# Patient Record
Sex: Female | Born: 1960 | Race: White | Hispanic: No | Marital: Married | Smoking: Never smoker
Health system: Southern US, Community
[De-identification: ages and names within clinical notes are randomized; demographics above are authoritative.]

## PROBLEM LIST (undated history)

## (undated) DIAGNOSIS — G709 Myoneural disorder, unspecified: Secondary | ICD-10-CM

## (undated) DIAGNOSIS — N200 Calculus of kidney: Secondary | ICD-10-CM

## (undated) HISTORY — DX: Myoneural disorder, unspecified: G70.9

## (undated) HISTORY — PX: CARPAL TUNNEL RELEASE: SHX101

## (undated) HISTORY — PX: COLONOSCOPY: SHX174

## (undated) HISTORY — PX: TONSILLECTOMY: SUR1361

---

## 1997-09-28 ENCOUNTER — Other Ambulatory Visit: Admission: RE | Admit: 1997-09-28 | Discharge: 1997-09-28 | Payer: Self-pay | Admitting: Obstetrics and Gynecology

## 1997-11-14 ENCOUNTER — Other Ambulatory Visit: Admission: RE | Admit: 1997-11-14 | Discharge: 1997-11-14 | Payer: Self-pay | Admitting: Obstetrics and Gynecology

## 1998-02-27 ENCOUNTER — Other Ambulatory Visit: Admission: RE | Admit: 1998-02-27 | Discharge: 1998-02-27 | Payer: Self-pay | Admitting: Obstetrics and Gynecology

## 1998-10-09 ENCOUNTER — Other Ambulatory Visit: Admission: RE | Admit: 1998-10-09 | Discharge: 1998-10-09 | Payer: Self-pay | Admitting: Obstetrics and Gynecology

## 1999-03-24 ENCOUNTER — Other Ambulatory Visit: Admission: RE | Admit: 1999-03-24 | Discharge: 1999-03-24 | Payer: Self-pay | Admitting: Obstetrics and Gynecology

## 1999-10-01 ENCOUNTER — Other Ambulatory Visit: Admission: RE | Admit: 1999-10-01 | Discharge: 1999-10-01 | Payer: Self-pay | Admitting: Obstetrics and Gynecology

## 1999-10-23 ENCOUNTER — Emergency Department (HOSPITAL_COMMUNITY): Admission: EM | Admit: 1999-10-23 | Discharge: 1999-10-23 | Payer: Self-pay

## 2000-11-24 ENCOUNTER — Other Ambulatory Visit: Admission: RE | Admit: 2000-11-24 | Discharge: 2000-11-24 | Payer: Self-pay | Admitting: Obstetrics and Gynecology

## 2001-12-21 ENCOUNTER — Other Ambulatory Visit: Admission: RE | Admit: 2001-12-21 | Discharge: 2001-12-21 | Payer: Self-pay | Admitting: Obstetrics and Gynecology

## 2002-09-25 ENCOUNTER — Ambulatory Visit (HOSPITAL_COMMUNITY): Admission: RE | Admit: 2002-09-25 | Discharge: 2002-09-25 | Payer: Self-pay | Admitting: Obstetrics and Gynecology

## 2002-09-25 ENCOUNTER — Encounter: Payer: Self-pay | Admitting: Obstetrics and Gynecology

## 2002-11-16 ENCOUNTER — Ambulatory Visit (HOSPITAL_COMMUNITY): Admission: RE | Admit: 2002-11-16 | Discharge: 2002-11-16 | Payer: Self-pay | Admitting: Obstetrics and Gynecology

## 2003-01-04 ENCOUNTER — Other Ambulatory Visit: Admission: RE | Admit: 2003-01-04 | Discharge: 2003-01-04 | Payer: Self-pay | Admitting: Obstetrics and Gynecology

## 2003-09-04 ENCOUNTER — Other Ambulatory Visit: Admission: RE | Admit: 2003-09-04 | Discharge: 2003-09-04 | Payer: Self-pay | Admitting: Obstetrics and Gynecology

## 2004-01-08 ENCOUNTER — Encounter: Admission: RE | Admit: 2004-01-08 | Discharge: 2004-01-08 | Payer: Self-pay | Admitting: Obstetrics and Gynecology

## 2004-03-02 ENCOUNTER — Inpatient Hospital Stay (HOSPITAL_COMMUNITY): Admission: AD | Admit: 2004-03-02 | Discharge: 2004-03-05 | Payer: Self-pay | Admitting: Obstetrics & Gynecology

## 2004-04-07 ENCOUNTER — Other Ambulatory Visit: Admission: RE | Admit: 2004-04-07 | Discharge: 2004-04-07 | Payer: Self-pay | Admitting: Obstetrics and Gynecology

## 2004-05-18 ENCOUNTER — Inpatient Hospital Stay (HOSPITAL_COMMUNITY): Admission: AD | Admit: 2004-05-18 | Discharge: 2004-05-18 | Payer: Self-pay | Admitting: Obstetrics and Gynecology

## 2004-07-15 ENCOUNTER — Ambulatory Visit (HOSPITAL_COMMUNITY): Admission: RE | Admit: 2004-07-15 | Discharge: 2004-07-15 | Payer: Self-pay | Admitting: Obstetrics and Gynecology

## 2005-01-31 ENCOUNTER — Ambulatory Visit (HOSPITAL_COMMUNITY): Admission: RE | Admit: 2005-01-31 | Discharge: 2005-01-31 | Payer: Self-pay | Admitting: Family Medicine

## 2005-02-16 HISTORY — PX: ENDOMETRIAL ABLATION: SHX621

## 2005-02-16 HISTORY — PX: TUBAL LIGATION: SHX77

## 2005-02-16 HISTORY — PX: DILATION AND CURETTAGE OF UTERUS: SHX78

## 2005-05-25 ENCOUNTER — Other Ambulatory Visit: Admission: RE | Admit: 2005-05-25 | Discharge: 2005-05-25 | Payer: Self-pay | Admitting: Obstetrics and Gynecology

## 2012-10-26 ENCOUNTER — Other Ambulatory Visit: Payer: Self-pay | Admitting: Obstetrics and Gynecology

## 2012-12-19 ENCOUNTER — Other Ambulatory Visit: Payer: Self-pay | Admitting: Gastroenterology

## 2013-01-10 ENCOUNTER — Other Ambulatory Visit: Payer: Self-pay | Admitting: Gastroenterology

## 2013-01-19 ENCOUNTER — Other Ambulatory Visit: Payer: Self-pay

## 2013-01-25 ENCOUNTER — Ambulatory Visit
Admission: RE | Admit: 2013-01-25 | Discharge: 2013-01-25 | Disposition: A | Discharge status: Home / Self Care | Payer: Managed Care, Other (non HMO) | Source: Ambulatory Visit | Attending: Gastroenterology | Admitting: Gastroenterology

## 2013-03-10 ENCOUNTER — Emergency Department (HOSPITAL_BASED_OUTPATIENT_CLINIC_OR_DEPARTMENT_OTHER)
Admission: EM | Admit: 2013-03-10 | Discharge: 2013-03-10 | Disposition: A | Discharge status: Home / Self Care | Payer: Managed Care, Other (non HMO) | Attending: Emergency Medicine | Admitting: Emergency Medicine

## 2013-03-10 ENCOUNTER — Encounter (HOSPITAL_BASED_OUTPATIENT_CLINIC_OR_DEPARTMENT_OTHER): Payer: Self-pay | Admitting: Emergency Medicine

## 2013-03-10 ENCOUNTER — Emergency Department (HOSPITAL_BASED_OUTPATIENT_CLINIC_OR_DEPARTMENT_OTHER): Payer: Managed Care, Other (non HMO)

## 2013-03-10 DIAGNOSIS — Z792 Long term (current) use of antibiotics: Secondary | ICD-10-CM | POA: Insufficient documentation

## 2013-03-10 DIAGNOSIS — N200 Calculus of kidney: Secondary | ICD-10-CM

## 2013-03-10 DIAGNOSIS — Z3202 Encounter for pregnancy test, result negative: Secondary | ICD-10-CM | POA: Insufficient documentation

## 2013-03-10 LAB — CBC WITH DIFFERENTIAL/PLATELET
BASOS ABS: 0 10*3/uL (ref 0.0–0.1)
Basophils Relative: 0 % (ref 0–1)
EOS PCT: 1 % (ref 0–5)
Eosinophils Absolute: 0.1 10*3/uL (ref 0.0–0.7)
HEMATOCRIT: 45.2 % (ref 36.0–46.0)
Hemoglobin: 14.8 g/dL (ref 12.0–15.0)
LYMPHS PCT: 40 % (ref 12–46)
Lymphs Abs: 2.6 10*3/uL (ref 0.7–4.0)
MCH: 29 pg (ref 26.0–34.0)
MCHC: 32.7 g/dL (ref 30.0–36.0)
MCV: 88.5 fL (ref 78.0–100.0)
MONO ABS: 0.5 10*3/uL (ref 0.1–1.0)
Monocytes Relative: 8 % (ref 3–12)
Neutro Abs: 3.3 10*3/uL (ref 1.7–7.7)
Neutrophils Relative %: 51 % (ref 43–77)
Platelets: 219 10*3/uL (ref 150–400)
RBC: 5.11 MIL/uL (ref 3.87–5.11)
RDW: 13.4 % (ref 11.5–15.5)
WBC: 6.5 10*3/uL (ref 4.0–10.5)

## 2013-03-10 LAB — COMPREHENSIVE METABOLIC PANEL
ALT: 90 U/L — ABNORMAL HIGH (ref 0–35)
AST: 42 U/L — AB (ref 0–37)
Albumin: 4.7 g/dL (ref 3.5–5.2)
Alkaline Phosphatase: 109 U/L (ref 39–117)
BUN: 13 mg/dL (ref 6–23)
CO2: 25 meq/L (ref 19–32)
CREATININE: 0.7 mg/dL (ref 0.50–1.10)
Calcium: 11.7 mg/dL — ABNORMAL HIGH (ref 8.4–10.5)
Chloride: 106 mEq/L (ref 96–112)
GFR calc non Af Amer: >90 mL/min (ref >90)
GLUCOSE: 106 mg/dL — AB (ref 70–99)
Potassium: 4.4 mEq/L (ref 3.7–5.3)
Sodium: 145 mEq/L (ref 137–147)
TOTAL PROTEIN: 7.7 g/dL (ref 6.0–8.3)
Total Bilirubin: 0.6 mg/dL (ref 0.3–1.2)

## 2013-03-10 LAB — URINALYSIS, ROUTINE W REFLEX MICROSCOPIC
Bilirubin Urine: NEGATIVE
Glucose, UA: NEGATIVE mg/dL
Ketones, ur: NEGATIVE mg/dL
NITRITE: NEGATIVE
Protein, ur: NEGATIVE mg/dL
SPECIFIC GRAVITY, URINE: 1.021 (ref 1.005–1.030)
Urobilinogen, UA: 0.2 mg/dL (ref 0.0–1.0)
pH: 6 (ref 5.0–8.0)

## 2013-03-10 LAB — URINE MICROSCOPIC-ADD ON

## 2013-03-10 LAB — PREGNANCY, URINE: PREG TEST UR: NEGATIVE

## 2013-03-10 MED ORDER — MORPHINE SULFATE 4 MG/ML IJ SOLN
4.0000 mg | Freq: Once | INTRAMUSCULAR | Status: AC
  Administered 2013-03-10: 4 mg via INTRAVENOUS
  Filled 2013-03-10: qty 1

## 2013-03-10 MED ORDER — ONDANSETRON HCL 4 MG/2ML IJ SOLN
4.0000 mg | Freq: Once | INTRAMUSCULAR | Status: AC
  Administered 2013-03-10: 4 mg via INTRAVENOUS
  Filled 2013-03-10: qty 2

## 2013-03-10 MED ORDER — OXYCODONE-ACETAMINOPHEN 5-325 MG PO TABS: 1.0000 | ORAL_TABLET | Freq: Four times a day (QID) | ORAL | Status: DC | PRN

## 2013-03-10 MED ORDER — CEPHALEXIN 500 MG PO CAPS: 500.0000 mg | ORAL_CAPSULE | Freq: Two times a day (BID) | ORAL | Status: DC

## 2013-03-10 NOTE — ED Provider Notes (Signed)
CSN: [id number]     Arrival date & time 03/10/13  8469 History   First MD Initiated Contact with Patient 03/10/13 1002     Chief Complaint  Patient presents with  . Flank Pain  . Abdominal Pain   (Consider location/radiation/quality/duration/timing/severity/associated sxs/prior Treatment) HPI  This is a 53 year old female with no significant past medical history who presents with right flank pain. Patient reports onset of pain yesterday. She states that initially she had noted pain in her right lower quadrant but now the pain is consistently in her right flank and radiates into her right lower quadrant. Currently her pain is 8/10. The pain is constant but waxes and wanes in intensity. Nothing makes the pain better or worse. Patient denies dysuria or hematuria. She has no history of kidney stones.   Patient does endorse nausea without vomiting.  Denies any fevers.  History reviewed. No pertinent past medical history. History reviewed. No pertinent past surgical history. History reviewed. No pertinent family history. History  Substance Use Topics  . Smoking status: Never Smoker   . Smokeless tobacco: Not on file  . Alcohol Use: Not on file   OB History   Grav Para Term Preterm Abortions TAB SAB Ect Mult Living                 Review of Systems  Constitutional: Negative for fever.  Respiratory: Negative for chest tightness and shortness of breath.   Cardiovascular: Negative for chest pain.  Gastrointestinal: Positive for nausea. Negative for vomiting, abdominal pain and diarrhea.  Genitourinary: Positive for flank pain. Negative for dysuria, frequency and hematuria.  Musculoskeletal: Negative for back pain.  Skin: Negative for wound.  Neurological: Negative for headaches.  Psychiatric/Behavioral: Negative for confusion.  All other systems reviewed and are negative.    Allergies  Review of patient's allergies indicates no known allergies.  Home Medications   Current  Outpatient Rx  Name  Route  Sig  Dispense  Refill  . cephALEXin (KEFLEX) 500 MG capsule   Oral   Take 1 capsule (500 mg total) by mouth 2 (two) times daily.   10 capsule   0   . oxyCODONE-acetaminophen (PERCOCET/ROXICET) 5-325 MG per tablet   Oral   Take 1 tablet by mouth every 6 (six) hours as needed for severe pain.   15 tablet   0    BP 130/80  Pulse 72  Temp(Src) 98.4 F (36.9 C) (Oral)  Resp 18  SpO2 100% Physical Exam  Nursing note and vitals reviewed. Constitutional: She is oriented to person, place, and time. She appears well-developed and well-nourished.  Uncomfortable appearing but nontoxic  HENT:  Head: Normocephalic and atraumatic.  Eyes: Pupils are equal, round, and reactive to light.  Neck: Neck supple.  Cardiovascular: Normal rate, regular rhythm and normal heart sounds.   Pulmonary/Chest: Effort normal. No respiratory distress. She has no wheezes.  Abdominal: Soft. Bowel sounds are normal. There is no tenderness. There is no rebound and no guarding.  Musculoskeletal: She exhibits no edema.  Neurological: She is alert and oriented to person, place, and time.  Skin: Skin is warm and dry.  Psychiatric: She has a normal mood and affect.    ED Course  Procedures (including critical care time) Labs Review Labs Reviewed  COMPREHENSIVE METABOLIC PANEL - Abnormal; Notable for the following:    Glucose, Bld 106 (*)    Calcium 11.7 (*)    AST 42 (*)    ALT 90 (*)  All other components within normal limits  URINALYSIS, ROUTINE W REFLEX MICROSCOPIC - Abnormal; Notable for the following:    APPearance CLOUDY (*)    Hgb urine dipstick MODERATE (*)    Leukocytes, UA SMALL (*)    All other components within normal limits  URINE MICROSCOPIC-ADD ON - Abnormal; Notable for the following:    Squamous Epithelial / LPF FEW (*)    Bacteria, UA MANY (*)    Crystals CA OXALATE CRYSTALS (*)    All other components within normal limits  URINE CULTURE  CBC WITH  DIFFERENTIAL  PREGNANCY, URINE   Imaging Review Ct Abdomen Pelvis Wo Contrast  03/10/2013   CLINICAL DATA:  Right flank pain.  EXAM: CT ABDOMEN AND PELVIS WITHOUT CONTRAST  TECHNIQUE: Multidetector CT imaging of the abdomen and pelvis was performed following the standard protocol without intravenous contrast.  COMPARISON:  Ultrasound 01/25/2013.  CT 01/31/2005.  FINDINGS: Multiple simple hepatic cysts noted. Similar findings noted on prior ultrasound and CT. Spleen is normal. Pancreas is normal. No biliary distention. Gallbladder is not distended. No gallbladder wall thickening. No pericholecystic fluid collection.  Adrenals are normal. A 2 mm stone is in the mid right ureter, image number 52/series 2. This results in moderate proximal hydroureter and hydronephrosis. Left kidney and renal collecting system are unremarkable. Bladder is nondistended. Innumerable calcifications in the pelvis consistent with phleboliths. Uterus and adnexa are unremarkable. No free pelvic fluid.  No significant adenopathy.  Aorta normal caliber.  Appendix normal. No inflammatory change in right or left lower quadrant. No bowel distention. Stomach is nondistended. Esophagogastric and the gastroduodenal regions are normal. No free air. No mesenteric masses. Small umbilical hernia with herniation of fat only.  Heart size normal. Mild subsegmental atelectasis lung bases. Degenerative changes lumbosacral spine and both hips.  IMPRESSION: 1. 2 mm stone in the right mid ureter with resultant right hydronephrosis and hydroureter. 2. Multiple simple hepatic cysts.   Electronically Signed   By: Marcello Moores  Register   On: 03/10/2013 11:13    EKG Interpretation   None       MDM   1. Kidney stones   2. Hypercalcemia    Patient presents with right-sided flank pain that radiates into her right groin.  Patient's history is suggestive of kidney stones. Other considerations include ovarian pathology or appendicitis.  Patient was given pain  medication. Lab work is unremarkable and shows normal kidney function.   Patient was noted to be hypercalcemic at 11.7.  Patient does have 3-6 white cells and bacteria in her urine. There is no overt infection and urine culture was sent. CT scan shows 2 mm stone with hydronephrosis and hydroureter. On reevaluation, patient reports complete resolution of her symptoms.  Patient was informed of her diagnosis.  She was encouraged to use aggressive hydration at home and expected management for the kidney stone. She will cover her with Keflex given the bacteria in her urine. Regarding patient's hypercalcemia, this is of unknown significance at this time. May clear following hydration. I have encouraged patient to followup with her primary doctor in one to 2 weeks for repeat calcium check as patient was informed that persistent hypercalcemia can be a result of more pathologic processes including cancer or  parathyroid disease.  After history, exam, and medical workup I feel the patient has been appropriately medically screened and is safe for discharge home. Pertinent diagnoses were discussed with the patient. Patient was given return precautions.     Merryl Hacker, MD  03/10/13 1314 

## 2013-03-10 NOTE — ED Notes (Signed)
Pt amb to room 9 with quick steady gait in nad. Pt reports right flank pain radiating to right lower quadrant. Pt states pain is constant but waxes and wanes.

## 2013-03-10 NOTE — Discharge Instructions (Signed)
Kidney Stones Kidney stones (urolithiasis) are deposits that form inside your kidneys. The intense pain is caused by the stone moving through the urinary tract. When the stone moves, the ureter goes into spasm around the stone. The stone is usually passed in the urine.  CAUSES   A disorder that makes certain neck glands produce too much parathyroid hormone (primary hyperparathyroidism).  A buildup of uric acid crystals, similar to gout in your joints.  Narrowing (stricture) of the ureter.  A kidney obstruction present at birth (congenital obstruction).  Previous surgery on the kidney or ureters.  Numerous kidney infections. SYMPTOMS   Feeling sick to your stomach (nauseous).  Throwing up (vomiting).  Blood in the urine (hematuria).  Pain that usually spreads (radiates) to the groin.  Frequency or urgency of urination. DIAGNOSIS   Taking a history and physical exam.  Blood or urine tests.  CT scan.  Occasionally, an examination of the inside of the urinary bladder (cystoscopy) is performed. TREATMENT   Observation.  Increasing your fluid intake.  Extracorporeal shock wave lithotripsy This is a noninvasive procedure that uses shock waves to break up kidney stones.  Surgery may be needed if you have severe pain or persistent obstruction. There are various surgical procedures. Most of the procedures are performed with the use of small instruments. Only small incisions are needed to accommodate these instruments, so recovery time is minimized. The size, location, and chemical composition are all important variables that will determine the proper choice of action for you. Talk to your health care provider to better understand your situation so that you will minimize the risk of injury to yourself and your kidney.  HOME CARE INSTRUCTIONS   Drink enough water and fluids to keep your urine clear or pale yellow. This will help you to pass the stone or stone fragments.  Strain  all urine through the provided strainer. Keep all particulate matter and stones for your health care provider to see. The stone causing the pain may be as small as a grain of salt. It is very important to use the strainer each and every time you pass your urine. The collection of your stone will allow your health care provider to analyze it and verify that a stone has actually passed. The stone analysis will often identify what you can do to reduce the incidence of recurrences.  Only take over-the-counter or prescription medicines for pain, discomfort, or fever as directed by your health care provider.  Make a follow-up appointment with your health care provider as directed.  Get follow-up X-rays if required. The absence of pain does not always mean that the stone has passed. It may have only stopped moving. If the urine remains completely obstructed, it can cause loss of kidney function or even complete destruction of the kidney. It is your responsibility to make sure X-rays and follow-ups are completed. Ultrasounds of the kidney can show blockages and the status of the kidney. Ultrasounds are not associated with any radiation and can be performed easily in a matter of minutes. SEEK MEDICAL CARE IF:  You experience pain that is progressive and unresponsive to any pain medicine you have been prescribed. SEEK IMMEDIATE MEDICAL CARE IF:   Pain cannot be controlled with the prescribed medicine.  You have a fever or shaking chills.  The severity or intensity of pain increases over 18 hours and is not relieved by pain medicine.  You develop a new onset of abdominal pain.  You feel faint or pass  out.  You are unable to urinate. MAKE SURE YOU:   Understand these instructions.  Will watch your condition.  Will get help right away if you are not doing well or get worse. Document Released: 02/02/2005 Document Revised: 10/05/2012 Document Reviewed: 07/06/2012 [facility] Patient Information 2014  [place].  Diet for Kidney Stones Kidney stones are small, hard masses that form inside your kidneys. They are made up of salts and minerals and often form when high levels build up in the urine. The minerals can then start to build up, crystalize, and stick together to form stones. There are several different types of kidney stones. The following types of stones may be influenced by dietary factors:   Calcium Oxalate Stones. An oxalate is a salt found in certain foods. Within the body, calcium can combine with oxalates to form calcium oxalate stones, which can be excreted in the urine in high amounts. This is the most common type of kidney stone.  Calcium Phosphate Stones. These stones may occur when the pH of the urine becomes too high, or less acidic, from too much calcium being excreted in the urine. The pH is a measure of how acidic or basic a substance is.  Uric Acid Stones. This type of stone occurs when the pH of the urine becomes too low, or very acidic, because substances called purines build up in the urine. Purines are found in animal proteins. When the urine is highly concentrated with acid, uric acid kidney stones can form.  Other risk factors for kidney stones include genetics, environment, and being overweight. Your caregiver may ask you to follow specific diet guidelines based on the type of stone you have to lessen the chances of your body making more kidney stones.  GENERAL GUIDELINES FOR ALL TYPES OF STONES  Drink plenty of fluid. Drink 12 16 cups of fluid a day, drinking mainly water.This is the most important thing you can do to prevent the formation of future kidney stones.  Maintain a healthy weight. Your caregiver or dietitian can help you determine what a healthy weight is for you. If you are overweight, weight loss may help prevent the formation of future kidney stones.  Eat a diet adequate in animal protein. Too much animal protein can contribute to the formation  of stones. Your dietitian can help you determine how much protein you should be eating. Avoid low carbohydrate, high protein diets.  Follow a balanced eating approach. The DASH diet, which stands for "Dietary Approaches to Stop Hypertension," is an effective meal plan for reducing stone formation. This diet is high in fruits, vegetables, dairy, and whole grains and low in animal protein. Ask your caregiver or dietitian for information about the DASH diet. ADDITIONAL DIET GUIDELINES FOR CALCIUM STONES Avoid foods high in salt. This includes table salt, salt seasonings, MSG, soy sauce, cured and processed meats, salted crackers and snack foods, fast food, and canned soups and foods. Ask your caregiver or dietitian for information about reducing sodium in your diet or following the low sodium diet.  Ensure adequate calcium intake. Use the following table for calcium guidelines:  Men 81 years old and younger  1000 mg/day.  Men 46 years old and older  1500 mg/day.  Women 51 51 years old  1000 mg/day.  Women 50 years and older  1500 mg/day. Your dietitian can help you determine if you are getting enough calcium in your diet. Foods that are high in calcium include dairy products, broccoli, cheese, yogurt, and  pudding. If you need to take a calcium supplement, take it only in the form of calcium citrate.  Avoid foods high in oxalate. Be sure that any supplements you take do not contain more than 500 mg of vitamin C. Vitamin C is converted into oxalate in the body. You do not need to avoid fruits and vegetables high in vitamin C.   Grains: High-fiber or bran cereal, whole-wheat bread, grits, barley, buckwheat, amaranth, pretzels, and fruitcake.  Vegetables: Dried beans, wax beans, dark leafy greens, eggplant, leeks, okra, parsley, rutabaga, tomato paste, watercress, zucchini, and escarole.  Fruit: Dried apricots, red currants, figs, kiwi, and rhubarb.  Meat and Meat Substitutes: Soybeans and foods made  from soy (soyburger, miso), dried beans, peanut butter.  Milk: Chocolate milk mixes and soymilk.  Fats and Oils: Nuts (peanuts, almonds, pecans, cashews, hazelnuts) and nut butters, sesame seeds, and tDahini paste.  Condiments/Miscellaneous: Chocolate, carob, marmalade, poppy seeds, instant iced tea, and juice from high-oxalate fruits.  Document Released: 05/30/2010 Document Revised: 08/04/2011 Document Reviewed: 07/20/2011 [facility] Patient Information 2014 [place].  Hypercalcemia Hypercalcemia means the calcium in your blood is too high. Calcium in our blood is important for the control of many things, such as:  Blood clotting.  Conducting of nerve impulses.  Muscle contraction.  Maintaining teeth and bone health.  Other body functions. In the bloodstream, calcium maintains a constant balance with another mineral, phosphate. Calcium is absorbed into the body through the small intestine. This is helped by Vitamin D. Calcium levels are maintained mostly by vitamin D and a hormone (parathyroid hormone). But the kidneys also help. Hypercalcemia can happen when the concentration of calcium is too high for the kidneys to maintain balance. The body maintains a balance between the calcium we eat and the calcium already in our body. If calcium intake is increased or we cannot use calcium properly, there may be problems. Some common sources of calcium are:   Dairy products.  Nuts.  Eggs.  Whole grains.  Legumes.  Green leafy vegetables. CAUSES There are many causes of this condition, but some common ones are:  Hyperparathyroidism. This is an over activity of the parathyroid gland.  Cancers of the breast, kidney, lung, head and neck are common causes of calcium increases.  Medications that cause you to urinate more often (diuretics), nausea, vomiting and diarrhea also increase the calcium in the blood.  Overuse of calcium-containing antacids. SYMPTOMS  Many patients with  mild hypercalcemia have no symptoms. For those with symptoms common problems include:  Loss of appetite.  Constipation.  Increased thirst.  Heart rhythm changes.  Abnormal thinking.  Nausea.  Abdominal pain.  Kidney stones.  Mood swings.  Coma and death when severe.  Vomiting.  Increased urination.  High blood pressure.  Confusion. DIAGNOSIS   Your caregiver will do a medical history and perform a physical exam on you.  Calcium and parathyroid hormone (PTH) may be measured with a blood test. TREATMENT   The treatment depends on the calcium level and what is causing the higher level. Hypercalcemia can be lifethreatening. Fast lowering of the calcium level may be necessary.  With normal kidney function, fluids can be given by vein to clear the excess calcium. Hemodialysis works well to reduce dangerous calcium levels if there is poor kidney function. This is a procedure in which a machine is used to filter out unwanted substances. The blood is then returned to the body.  Drugs, such as diuretics, can be given after adequate fluid  intake is established. These medications help the kidneys get rid of extra calcium. Drugs that lessen (inhibit) bone loss are helpful in gaining long-term control. Phosphate pills help lower high calcium levels caused by a low supply of phosphate. Anti-inflammatory agents such as steroids are helpful with some cancers and toxic levels of vitamin D.  Treatment of the underlying cause of the hypercalcemia will also correct the imbalance. Hyperparathyroidism is usually treated by surgical removal of one or more of the parathyroid glands and any tissue, other than the glands themselves, that is producing too much hormone.  The hypercalcemia caused by cancer is difficult to treat without controlling the cancer. Symptoms can be improved with fluids and drug therapy as outlined above. PROGNOSIS   Surgery to remove the parathyroid glands is usually  successful. This also depends on the amount of damage to the kidneys and whether or not it can be treated.  Mild hypercalcemia can be controlled with good fluid intake and the use of effective medications.  Hypercalcemia often develops as a late complication of cancer. The expected outlook is poor without effective anticancer therapy. PREVENTION   If you are at risk for developing hypercalcemia, be familiar with early symptoms. Report these to your caregiver.  Good fluid intake (up to four quarts of liquid a day if possible) is helpful.  Try to control nausea and vomiting, and treat fevers to avoid dehydration.  Lowering the amount of calcium in your diet is not necessary. High blood calcium reduces absorption of calcium in the intestine.  Stay as active as possible. SEEK IMMEDIATE MEDICAL CARE IF:   You develop chest pain, sweating, or shortness of breath.  You get confused, feel faint or pass out.  You develop severe nausea and vomiting. MAKE SURE YOU:   Understand these instructions.  Will watch your condition.  Will get help right away if you are not doing well or get worse. Document Released: 04/18/2004 Document Revised: 05/30/2012 Document Reviewed: 01/28/2010 [facility] Patient Information 2014 [place], [place].

## 2013-03-11 LAB — URINE CULTURE: Colony Count: 15000

## 2013-06-19 ENCOUNTER — Encounter (INDEPENDENT_AMBULATORY_CARE_PROVIDER_SITE_OTHER): Payer: Self-pay | Admitting: Surgery

## 2013-06-19 ENCOUNTER — Ambulatory Visit (INDEPENDENT_AMBULATORY_CARE_PROVIDER_SITE_OTHER): Payer: Managed Care, Other (non HMO) | Admitting: Surgery

## 2013-06-19 NOTE — Patient Instructions (Signed)
Parathyroidectomy A parathyroidectomy is surgery to remove one or more parathyroid glands. These glands produce a hormone (parathyroid hormone) that helps control the level of calcium in your body. The glands are very small, about the size of a pea. They are located in your neck, close to your thyroid gland and your Adam's apple. Most people (85%) have four parathyroid glands,some people may have one or two more than that. Hyperparathyroidism is when too much parathyroid hormone is being produced. Usually this is caused by one of the parathyroid glands becoming enlarged, but it can also be caused by more than one of the glands. Hyperparathyroidism is found during blood tests that show high calcium in the blood. Parathyroid hormone levels will also be elevated. Cancer also can cause hyperparathyroidism, but this is rare. For the most common type of hyperparathyroidism, the treatment is surgical removal of the parathyroid gland that is enlarged. For patients with kidney failure and hyperparathyroidism, other treatment will be tried before surgery is done on the parathyroid.  Many times x-ray studies are done to find out which parathyroid gland or glands is malfunctioning. The decision about the best treatment for hyperparathyroidism is between the patient, their primary doctor, an endocrinologist, and a surgeon experienced in parathyroid surgery. LET YOUR CAREGIVER KNOW ABOUT:  Any allergies.  All medications you are taking, including:  Herbs, eyedrops, over-the-counter medications and creams.  Blood thinners (anticoagulants), aspirin or other drugs that could affect blood clotting.  Use of steroids (by mouth or as creams).  Previous problems with anesthetics, including local anesthetics.  Possibility of pregnancy, if this applies.  Any history of blood clots.  Any history of bleeding or other blood problems.  Previous surgery.  Smoking history.  Other health problems. RISKS AND  COMPLICATIONS   Short-term possibilities include:  Excessive bleeding.  Pain.  Infection near the incision.  Slow healing.  Pooling of blood under the wound (hematoma).  Damage to nerves in your neck.  Blood clots.  Difficulty breathing. This is very rare. It also is almost always temporary.  Longer-term possibilities include:  Scarring.  Skin damage.  Damage to blood vessels in the area.  Need for additional surgery.  A hoarse or weak voice. This is usually temporary. It can be the result of nerve damage.  Development of hypoparathyroidism. This means you are not making enough parathyroid hormone. It is rare. If it occurs, you will need to take calcium supplements daily. BEFORE THE PROCEDURE  Sometimes the surgery is done on an outpatient basis. This means you could go home the same day as your surgery. Other times, people need to stay in the hospital overnight. Ask your surgeon what you should expect.  If your surgery will be an outpatient procedure, arrange for someone to drive you home after the surgery.  Two weeks before your surgery, stop using aspirin and non-steroidal anti-inflammatory drugs (NSAID's) for pain relief. This includes prescription drugs and over-the-counter drugs such as ibuprofen and naproxen. Also stop taking vitamin E.  If you take blood-thinners, ask your healthcare provider when you should stop taking them.  Do not eat or drink for about 8 hours before your surgery.  You might be asked to shower or wash with a special antibacterial soap before the procedure.  Arrive at least an hour before the surgery, or whenever your surgeon recommends. This will give you time to check in and fill out any needed paperwork. PROCEDURE  The preparation:  You will change into a hospital gown.  You  will be given an IV. A needle will be inserted in your arm. Medication will be able to flow directly into your body through this needle.  You might be given a  sedative to help you relax.  You will be given a drug that puts you to sleep during the surgery (general anesthetic).  The procedure:  Once you are asleep, the surgeon will make a small cut (incision) in your lower neck. Ask your surgeon where the incision will be.  The surgeon will look for the gland(s) that are not working well. Often a tissue sample from a gland is used to determine this.  Any glands that are not working well will be removed.  The surgeon will close the incision with stitches, often these are hidden under the skin. AFTER THE PROCEDURE  You will stay in a recovery area until the anesthesia has worn off. Your blood pressure and heart rate will be checked.  If your surgery was an outpatient procedure, you will go home the same day.  If you need to stay in the hospital, you will be moved to a hospital room. You will probably stay for two to three days. This will depend on how quickly you recover.  While you are in the hospital, your blood will be tested to check the calcium levels in your body. HOME CARE INSTRUCTIONS   Take any medication that your surgeon prescribes. Follow the directions carefully. Take all of the medication.  Ask your surgeon whether you can take over-the-counter medicines for pain, discomfort or fever. Do not take aspirin without permission from the surgeon. Aspirin increases the chances of bleeding.  Do not get the wound wet for the first few days after surgery (or until the surgeon tells you it is OK).  After this procedure, many patients may develop low calcium levels in the blood. It is critical that you see your medical caregiver to have this monitored and managed.     SEEK MEDICAL CARE IF:   You notice blood or fluid leaking from the wound, or it becomes red or swollen.  You have trouble breathing.  You have trouble speaking.  You become nauseous or throw up for more than two days after the surgery.  You have a fever or  persistent symptoms for more than 2-3 days. SEEK IMMEDIATE MEDICAL CARE IF:   Breathing becomes more difficult.  You have a fever and your symptoms suddenly get worse. Document Released: 05/01/2008 Document Revised: 01/20/2012 Document Reviewed: 05/01/2008 [facility] Patient Information 2014 [place], [place]. Hypercalcemia Hypercalcemia means the calcium in your blood is too high. Calcium in our blood is important for the control of many things, such as:  Blood clotting.  Conducting of nerve impulses.  Muscle contraction.  Maintaining teeth and bone health.  Other body functions. In the bloodstream, calcium maintains a constant balance with another mineral, phosphate. Calcium is absorbed into the body through the small intestine. This is helped by Vitamin D. Calcium levels are maintained mostly by vitamin D and a hormone (parathyroid hormone). But the kidneys also help. Hypercalcemia can happen when the concentration of calcium is too high for the kidneys to maintain balance. The body maintains a balance between the calcium we eat and the calcium already in our body. If calcium intake is increased or we cannot use calcium properly, there may be problems. Some common sources of calcium are:   Dairy products.  Nuts.  Eggs.  Whole grains.  Legumes.  Green leafy vegetables. CAUSES There  are many causes of this condition, but some common ones are:  Hyperparathyroidism. This is an over activity of the parathyroid gland.  Cancers of the breast, kidney, lung, head and neck are common causes of calcium increases.  Medications that cause you to urinate more often (diuretics), nausea, vomiting and diarrhea also increase the calcium in the blood.  Overuse of calcium-containing antacids. SYMPTOMS  Many patients with mild hypercalcemia have no symptoms. For those with symptoms common problems include:  Loss of appetite.  Constipation.  Increased thirst.  Heart rhythm  changes.  Abnormal thinking.  Nausea.  Abdominal pain.  Kidney stones.  Mood swings.  Coma and death when severe.  Vomiting.  Increased urination.  High blood pressure.  Confusion. DIAGNOSIS   Your caregiver will do a medical history and perform a physical exam on you.  Calcium and parathyroid hormone (PTH) may be measured with a blood test. TREATMENT   The treatment depends on the calcium level and what is causing the higher level. Hypercalcemia can be lifethreatening. Fast lowering of the calcium level may be necessary.  With normal kidney function, fluids can be given by vein to clear the excess calcium. Hemodialysis works well to reduce dangerous calcium levels if there is poor kidney function. This is a procedure in which a machine is used to filter out unwanted substances. The blood is then returned to the body.  Drugs, such as diuretics, can be given after adequate fluid intake is established. These medications help the kidneys get rid of extra calcium. Drugs that lessen (inhibit) bone loss are helpful in gaining long-term control. Phosphate pills help lower high calcium levels caused by a low supply of phosphate. Anti-inflammatory agents such as steroids are helpful with some cancers and toxic levels of vitamin D.  Treatment of the underlying cause of the hypercalcemia will also correct the imbalance. Hyperparathyroidism is usually treated by surgical removal of one or more of the parathyroid glands and any tissue, other than the glands themselves, that is producing too much hormone.  The hypercalcemia caused by cancer is difficult to treat without controlling the cancer. Symptoms can be improved with fluids and drug therapy as outlined above. PROGNOSIS   Surgery to remove the parathyroid glands is usually successful. This also depends on the amount of damage to the kidneys and whether or not it can be treated.  Mild hypercalcemia can be controlled with good fluid  intake and the use of effective medications.  Hypercalcemia often develops as a late complication of cancer. The expected outlook is poor without effective anticancer therapy. PREVENTION   If you are at risk for developing hypercalcemia, be familiar with early symptoms. Report these to your caregiver.  Good fluid intake (up to four quarts of liquid a day if possible) is helpful.  Try to control nausea and vomiting, and treat fevers to avoid dehydration.  Lowering the amount of calcium in your diet is not necessary. High blood calcium reduces absorption of calcium in the intestine.  Stay as active as possible. SEEK IMMEDIATE MEDICAL CARE IF:   You develop chest pain, sweating, or shortness of breath.  You get confused, feel faint or pass out.  You develop severe nausea and vomiting. MAKE SURE YOU:   Understand these instructions.  Will watch your condition.  Will get help right away if you are not doing well or get worse. Document Released: 04/18/2004 Document Revised: 05/30/2012 Document Reviewed: 01/28/2010 [facility] Patient Information 2014 [place], [place].

## 2013-06-19 NOTE — Progress Notes (Signed)
Patient ID: [name], female   DOB: 02-20-1960, 53 y.o.   MRN: [id number]  Chief Complaint  Patient presents with  . eval parathyroid    HPI [name] is a 53 y.o. female.  Pt sent at the request of Dr  Hartley Barefoot for hypercalcemia and elevated PTH hormone level.   Pt seen in ED 3 months ago for kidney stones. Work up shows elevated serum calcium to 11.4 and PTH level of 217.9. Denies abdominal pain or agitation.  This is her first kidney stone.  HPI  History reviewed. No pertinent past medical history.  Past Surgical History  Procedure Laterality Date  . Tubal ligation    . Ablation    . Dilation and curettage of uterus      Family History  Problem Relation Age of Onset  . Heart disease Mother     Social History History  Substance Use Topics  . Smoking status: Never Smoker   . Smokeless tobacco: Not on file  . Alcohol Use: Yes    No Known Allergies  No current outpatient prescriptions on file.   No current facility-administered medications for this visit.    Review of Systems Review of Systems  Constitutional: Negative for fever, chills and unexpected weight change.  HENT: Negative for congestion, hearing loss, sore throat, trouble swallowing and voice change.   Eyes: Negative for visual disturbance.  Respiratory: Negative for cough and wheezing.   Cardiovascular: Negative for chest pain, palpitations and leg swelling.  Gastrointestinal: Negative for nausea, vomiting, abdominal pain, diarrhea, constipation, blood in stool, abdominal distention and anal bleeding.  Genitourinary: Positive for flank pain. Negative for hematuria, vaginal bleeding and difficulty urinating.  Musculoskeletal: Negative for arthralgias.  Skin: Negative for rash and wound.  Neurological: Negative for seizures, syncope and headaches.  Hematological: Negative for adenopathy. Does not bruise/bleed easily.  Psychiatric/Behavioral: Negative for confusion.    Blood  pressure 128/80, pulse 76, temperature 97.5 F (36.4 C), height 5\' 3"  (1.6 m), weight 188 lb 3.2 oz (85.367 kg).  Physical Exam Physical Exam  Constitutional: She is oriented to person, place, and time. She appears well-developed and well-nourished.  HENT:  Head: Normocephalic and atraumatic.  Eyes: Pupils are equal, round, and reactive to light. No scleral icterus.  Neck: No tracheal deviation present. No thyromegaly present.  Cardiovascular: Normal rate and regular rhythm.   Pulmonary/Chest: Effort normal and breath sounds normal.  Abdominal: Soft.  Musculoskeletal: Normal range of motion.  Lymphadenopathy:    She has no cervical adenopathy.  Neurological: She is alert and oriented to person, place, and time.  Skin: Skin is warm and dry.  Psychiatric: She has a normal mood and affect. Her behavior is normal. Judgment and thought content normal.    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  Will need parathyroidectomy.  The procedure has been discussed with the patient.  Alternative therapies have been discussed with the patient.  Operative risks include bleeding,  Infection,  Organ injury, Recurrent laryngeal  Nerve injury,  Blood vessel injury,  DVT,  Pulmonary embolism,low calcium  Death,  And possible reoperation.  Medical management risks include worsening of present situation.  The success of the procedure is 50 -95 % at treating patients symptoms.  The patient understands and agrees to proceed.       Labrea Eccleston A. Kadelyn Dimascio  06/19/2013, 2:41 PM

## 2013-06-22 ENCOUNTER — Ambulatory Visit
Admission: RE | Admit: 2013-06-22 | Discharge: 2013-06-22 | Disposition: A | Discharge status: Home / Self Care | Payer: Managed Care, Other (non HMO) | Source: Ambulatory Visit | Attending: Surgery | Admitting: Surgery

## 2013-07-04 ENCOUNTER — Encounter (HOSPITAL_COMMUNITY)
Admission: RE | Admit: 2013-07-04 | Discharge: 2013-07-04 | Disposition: A | Discharge status: Home / Self Care | Payer: Managed Care, Other (non HMO) | Source: Ambulatory Visit | Attending: Surgery | Admitting: Surgery

## 2013-07-04 MED ORDER — TECHNETIUM TC 99M SESTAMIBI GENERIC - CARDIOLITE
25.0000 | Freq: Once | INTRAVENOUS | Status: AC | PRN
  Administered 2013-07-04: 25 via INTRAVENOUS

## 2013-08-08 ENCOUNTER — Telehealth (INDEPENDENT_AMBULATORY_CARE_PROVIDER_SITE_OTHER): Payer: Self-pay

## 2013-08-08 NOTE — Telephone Encounter (Signed)
Called pt back letting her know US shows adenoma but sestamibi shows no adenoma. The 2 tests are showing 2 different results. Will review with Dr Brantley Stage next week and call her then.

## 2013-08-08 NOTE — Telephone Encounter (Signed)
Message copied by Carlene Coria on Tue Aug 08, 2013  4:34 PM ------      Message from: [place], [place]: Mon Aug 07, 2013 11:23 AM      Contact: [phone]       She was here on 06/19/13 and dr.cornette sent her for some test she had one done on 06/22/13 and one on 05/19/215 but nobody has called her about this will you please check in on this. ------

## 2013-08-14 NOTE — Telephone Encounter (Signed)
Called patient back stating Dr Brantley Stage does feel she will need parathyroid surgery. Made appt for pt to come in and discuss surgery.

## 2013-08-28 ENCOUNTER — Ambulatory Visit (INDEPENDENT_AMBULATORY_CARE_PROVIDER_SITE_OTHER): Payer: Managed Care, Other (non HMO) | Admitting: Surgery

## 2013-08-28 ENCOUNTER — Encounter (INDEPENDENT_AMBULATORY_CARE_PROVIDER_SITE_OTHER): Payer: Self-pay | Admitting: Surgery

## 2013-08-28 VITALS — BP 130/64 | HR 72 | Temp 98.0°F | Resp 18 | Ht 62.0 in | Wt 189.0 lb

## 2013-08-28 DIAGNOSIS — E210 Primary hyperparathyroidism: Secondary | ICD-10-CM

## 2013-08-28 NOTE — Progress Notes (Signed)
Subjective:     Patient ID: [name], female   DOB: 08/07/1960, 53 y.o.   MRN: [id number]  HPI Patient returns after sestamibi scan. No evidence of parathyroid activity noted. Ultrasound showed a 1.7 cm mass just adjacent to the inferior pole left thyroid lobe consistent with adenoma. She has history of kidney stones, elevated calcium and elevated PTH.  Review of Systems  HENT: Negative.   Cardiovascular: Negative.   Genitourinary: Positive for hematuria.       Objective:   Physical Exam  Constitutional: She appears well-developed and well-nourished.  Neck: Normal range of motion. Neck supple. No thyromegaly present.  Lymphadenopathy:    She has no cervical adenopathy.  Skin: Skin is warm and dry.  Psychiatric: She has a normal mood and affect. Her behavior is normal. Judgment and thought content normal.  CLINICAL DATA: Parathyroid adenoma  EXAM:  THYROID ULTRASOUND  TECHNIQUE:  Ultrasound examination of the thyroid gland and adjacent soft  tissues was performed.  COMPARISON: None.  FINDINGS:  Right thyroid lobe  Measurements: 3.3 x 1.3 x 1.6 cm. Tiny 3 mm hypoechoic solid nodule  in the superior gland.  Left thyroid lobe  Measurements: 3.8 x 1.4 x 1.2 cm. Circumscribed, homogeneous 5 x 4 x  4 mm solid hypoechoic nodule in the superior aspect of the gland.  Macro lobulated approximately 1.7 x 0.9 x 1.1 cm hypoechoic solid  mass noted adjacent to the inferior tip of the left gland.  Isthmus  Thickness: 0.2 cm. No nodules visualized.  Lymphadenopathy  None visualized.  IMPRESSION:  1. There is a 1.7 cm macro lobulated hypoechoic mass adjacent to the  inferior pole of the left thyroid gland. Differential considerations  include exophytic thyroid nodule and parathyroid nodule/adenoma.  Consider nuclear medicine sestamibi scan for correlation.  2. Tiny sub-cm hypoechoic solid nodules, 1 each in the upper aspect  of the right and left side of the gland. Findings  do not meet  current SRU consensus criteria for biopsy. Follow-up by clinical  exam is recommended. If patient has known risk factors for thyroid  carcinoma, consider follow-up ultrasound in 12 months. If patient is  clinically hyperthyroid, consider nuclear medicine thyroid uptake  and scan. Reference: Management of Thyroid Nodules Detected at [place]:  Society of Radiologists in [place]. Radiology 2005; [id number].  Electronically Signed  By: Jacqulynn Cadet M.D.  On: 06/22/2013 12:17  CMP     Component Value Date/Time   NA 145 03/10/2013 1005   K 4.4 03/10/2013 1005   CL 106 03/10/2013 1005   CO2 25 03/10/2013 1005   GLUCOSE 106* 03/10/2013 1005   BUN 13 03/10/2013 1005   CREATININE 0.70 03/10/2013 1005   CALCIUM 11.7* 03/10/2013 1005   PROT 7.7 03/10/2013 1005   ALBUMIN 4.7 03/10/2013 1005   AST 42* 03/10/2013 1005   ALT 90* 03/10/2013 1005   ALKPHOS 109 03/10/2013 1005   BILITOT 0.6 03/10/2013 1005   GFRNONAA >90 03/10/2013 1005   GFRAA >90 03/10/2013 1005       Assessment:     Primary hyperparathyroidism    Plan:     Unfortunately she did not localized by sestamibi. My suspicion side she has primary hyperparathyroidism and ultrasound verifies what appears to be a large inferior left gland. Discussed this with the patient. Discussed neck exploration and evaluation of all 4 glands. Risk of bleeding, infection, surgical failure to identify glands, nerve injury, injury to adjacent structures, low calcium requiring replacement, and  failure rates to find the gland at about 5%. She understands and wishes to proceed.

## 2013-08-28 NOTE — Patient Instructions (Signed)
Parathyroid Hormone This is a test to determine whether PTH levels are responding normally to changes in blood calcium levels. It also helps to distinguish the cause of calcium imbalances, and to evaluate parathyroid function. When calcium blood levels are higher or lower than normal, and when your caregiver may want to determine how well your parathyroid glands are working. Parathyroid hormone (PTH) helps the body maintain stable levels of calcium in the blood. It is part of a 'feedback loop' that includes calcium, PTH, vitamin D, and, to some extent, phosphate and magnesium. Conditions and diseases that disrupt this feedback loop can cause inappropriate elevations or decreases in calcium and PTH levels and lead to symptoms of hypercalcemia (raised blood levels of calcium) or hypocalcemia (low blood levels of calcium).  PTH is produced by four parathyroid glands that are located in the neck beside the thyroid gland. Normally, these glands secrete PTH into the bloodstream in response to low blood calcium levels. Parathyroid hormone then works in three ways to help raise blood calcium levels back to normal. It takes calcium from the body's bone, stimulates the activation of vitamin D in the kidney (which in turn increases the absorption of calcium from the intestines), and suppresses the excretion of calcium in the urine (while encouraging excretion of phosphate). As calcium levels begin to increase in the blood, PTH normally decreases. PREPARATION FOR TEST You should have nothing to eat or drink except for water after midnight on the day of the test or as directed by your caregiver. A blood sample is obtained by inserting a needle into a vein in the arm. NORMAL FINDINGS Conventional Normal  PTH intact (whole)  Assay includes intact PTH  Values (pg/mL)10-65  SI Units (ng/L)10-65  PTH N-terminalN-terminal  Values (pg/mL) 8-24  SI Units (ng/L)8-24  PTH C-terminal  Assay Includes  C-terminal  Values (pg/mL) 50-330  SI Units (ng/L) 50-330  Intact PTH  Midmolecule Ranges for normal findings may vary among different laboratories and hospitals. You should always check with your doctor after having lab work or other tests done to discuss the meaning of your test results and whether your values are considered within normal limits. MEANING OF TEST  Your caregiver will go over the test results with you and discuss the importance and meaning of your results, as well as treatment options and the need for additional tests if necessary. OBTAINING THE TEST RESULTS  It is your responsibility to obtain your test results. Ask the lab or department performing the test when and how you will get your results. Document Released: 03/07/2004 Document Revised: 04/27/2011 Document Reviewed: 01/15/2008 [facility] Patient Information 2015 [place], [place]. This information is not intended to replace advice given to you by your health care provider. Make sure you discuss any questions you have with your health care provider.

## 2013-09-14 ENCOUNTER — Encounter (HOSPITAL_COMMUNITY): Payer: Self-pay | Admitting: Pharmacy Technician

## 2013-09-21 ENCOUNTER — Encounter (HOSPITAL_COMMUNITY)
Admission: RE | Admit: 2013-09-21 | Discharge: 2013-09-21 | Disposition: A | Discharge status: Home / Self Care | Payer: Managed Care, Other (non HMO) | Source: Ambulatory Visit | Attending: Surgery | Admitting: Surgery

## 2013-09-21 ENCOUNTER — Encounter (HOSPITAL_COMMUNITY): Payer: Self-pay

## 2013-09-21 DIAGNOSIS — Z01812 Encounter for preprocedural laboratory examination: Secondary | ICD-10-CM | POA: Insufficient documentation

## 2013-09-21 DIAGNOSIS — Z01818 Encounter for other preprocedural examination: Secondary | ICD-10-CM | POA: Insufficient documentation

## 2013-09-21 HISTORY — DX: Calculus of kidney: N20.0

## 2013-09-21 LAB — COMPREHENSIVE METABOLIC PANEL
ALBUMIN: 4.2 g/dL (ref 3.5–5.2)
ALT: 103 U/L — ABNORMAL HIGH (ref 0–35)
AST: 49 U/L — AB (ref 0–37)
Alkaline Phosphatase: 123 U/L — ABNORMAL HIGH (ref 39–117)
Anion gap: 11 (ref 5–15)
BUN: 10 mg/dL (ref 6–23)
CALCIUM: 11.1 mg/dL — AB (ref 8.4–10.5)
CO2: 25 mEq/L (ref 19–32)
Chloride: 108 mEq/L (ref 96–112)
Creatinine, Ser: 0.54 mg/dL (ref 0.50–1.10)
GFR calc Af Amer: >90 mL/min (ref >90)
GFR calc non Af Amer: >90 mL/min (ref >90)
Glucose, Bld: 118 mg/dL — ABNORMAL HIGH (ref 70–99)
Potassium: 4.1 mEq/L (ref 3.7–5.3)
Sodium: 144 mEq/L (ref 137–147)
Total Bilirubin: 0.4 mg/dL (ref 0.3–1.2)
Total Protein: 7 g/dL (ref 6.0–8.3)

## 2013-09-21 LAB — CBC WITH DIFFERENTIAL/PLATELET
BASOS ABS: 0 10*3/uL (ref 0.0–0.1)
Basophils Relative: 1 % (ref 0–1)
EOS PCT: 2 % (ref 0–5)
Eosinophils Absolute: 0.1 10*3/uL (ref 0.0–0.7)
HCT: 43 % (ref 36.0–46.0)
Hemoglobin: 14 g/dL (ref 12.0–15.0)
Lymphocytes Relative: 45 % (ref 12–46)
Lymphs Abs: 2.5 10*3/uL (ref 0.7–4.0)
MCH: 28.6 pg (ref 26.0–34.0)
MCHC: 32.6 g/dL (ref 30.0–36.0)
MCV: 87.8 fL (ref 78.0–100.0)
Monocytes Absolute: 0.4 10*3/uL (ref 0.1–1.0)
Monocytes Relative: 7 % (ref 3–12)
Neutro Abs: 2.5 10*3/uL (ref 1.7–7.7)
Neutrophils Relative %: 45 % (ref 43–77)
PLATELETS: 211 10*3/uL (ref 150–400)
RBC: 4.9 MIL/uL (ref 3.87–5.11)
RDW: 13.4 % (ref 11.5–15.5)
WBC: 5.6 10*3/uL (ref 4.0–10.5)

## 2013-09-21 NOTE — Pre-Procedure Instructions (Signed)
DELONNA NEY  09/21/2013   Your procedure is scheduled on:  Thursday September 28, 2013 at 1:45 PM.  Report to [facility] Admitting at 11:45 AM.  Call this number if you have problems the morning of surgery: [phone]   Remember:   Do not eat food or drink liquids after midnight.   Take these medicines the morning of surgery with A SIP OF WATER: NONE   Discontinue aspirin and herbal medications 5 days prior to surgery   Do not wear jewelry, make-up or nail polish.  Do not wear lotions, powders, or perfumes.   Do not shave 48 hours prior to surgery.   Do not bring valuables to the hospital.  [facility] is not responsible for any belongings or valuables.               Contacts, dentures or bridgework may not be worn into surgery.  Leave suitcase in the car. After surgery it may be brought to your room.  For patients admitted to the hospital, discharge time is determined by your treatment team.               Patients discharged the day of surgery will not be allowed to drive home.  Name and phone number of your driver: Family/Friend  Special Instructions: Shower the night before and the morning of your surgery using CHG soap   Please read over the following fact sheets that you were given: Pain Booklet, Coughing and Deep Breathing and Surgical Site Infection Prevention

## 2013-09-22 ENCOUNTER — Encounter (HOSPITAL_COMMUNITY): Payer: Self-pay

## 2013-09-22 NOTE — Progress Notes (Signed)
Anesthesia Chart Review:  Patient is a 53 year old female scheduled for parathyroidectomy on 09/28/13 by Dr. Brantley Stage. (Referred by urologist Dr. Hartley Barefoot). She has a 1.7 cm mass just adjacent to the inferior pole of the left thyroid lobe consistent with adenoma, although not localized by sestamibi.  It is felt that she has primary hyperparathyroidism.  History includes nephrolithiasis, non-smoker. BMI is consistent with obesity.  No medications are listed. No PCP listed.   Thyroid ultrasound on 06/22/13 showed: 1. There is a 1.7 cm macro lobulated hypoechoic mass adjacent to the inferior pole of the left thyroid gland. Differential considerations include exophytic thyroid nodule and parathyroid nodule/adenoma. Consider nuclear medicine sestamibi scan for correlation. 2. Tiny sub-cm hypoechoic solid nodules, 1 each in the upper aspect  of the right and left side of the gland. Findings do not meet current SRU consensus criteria for biopsy. Follow-up by clinical exam is recommended. If patient has known risk factors for thyroid carcinoma, consider follow-up ultrasound in 12 months. If patient is clinically hyperthyroid, consider nuclear medicine thyroid uptake and scan. (This was done on 07/04/13 and showed no parathyroid adenoma localized within thyroid bed or chest.)    Preoperative labs noted.  Ca 11.1.  Glucose 118. Alk Phos 123. AST/ALT 49/103 (prevously 42/90 on 03/10/13).  CBC with diff WNL. She drinks two glasses of wine daily.   Ultrasound of the liver on 02/04/13 showed: Normal hepatic parenchymal echogenicity. Within the caudate lobe there is a 2.4 x 1.9 x 2.1 cm cyst. This measured 1.9 x 1.7 cm on prior CT. Additionally within the left hepatic lobe there is a 2.4 x 1.9 x 2.1 cm cyst. Findings where felt compatible with simple hepatic cysts.  CT of the abd/pelvis on 03/10/13 showed: 1. 2 mm stone in the right mid ureter with resultant right hydronephrosis and hydroureter. 2. Multiple simple hepatic  cysts.  Patient with elevated LFTs, not significantly changed over the past 7 months.  Unclear etiology.  Daily ETOH.  No meds listed. PLT WNL. Hepatic cysts but otherwise no liver or gallbladder/biliary abnormalities on 02/2013 CT.  Reviewed with anesthesiologist Dr. Linna Caprice.  Since LFT stable and no thrombocytopenia, anticipate that she can proceed as planned.  Recommend PT/PTT on arrival.  No need for preoperative CXR from an anesthesia standpoint based on history.  George Hugh [facility] Short Stay Center/Anesthesiology Phone [phone] 09/22/2013 5:09 PM

## 2013-09-27 MED ORDER — CEFAZOLIN SODIUM-DEXTROSE 2-3 GM-% IV SOLR
2.0000 g | INTRAVENOUS | Status: AC
  Administered 2013-09-28: 2 g via INTRAVENOUS
  Filled 2013-09-27: qty 50

## 2013-09-27 MED ORDER — CHLORHEXIDINE GLUCONATE 4 % EX LIQD
1.0000 "application " | Freq: Once | CUTANEOUS | Status: DC
  Filled 2013-09-27: qty 15

## 2013-09-28 ENCOUNTER — Ambulatory Visit (HOSPITAL_COMMUNITY): Payer: Managed Care, Other (non HMO) | Admitting: Certified Registered"

## 2013-09-28 ENCOUNTER — Encounter (HOSPITAL_COMMUNITY): Payer: Managed Care, Other (non HMO) | Admitting: Vascular Surgery

## 2013-09-28 ENCOUNTER — Encounter (HOSPITAL_COMMUNITY)
Admission: RE | Disposition: A | Discharge status: Home / Self Care | Payer: Self-pay | Source: Ambulatory Visit | Attending: Surgery

## 2013-09-28 ENCOUNTER — Encounter (HOSPITAL_COMMUNITY): Payer: Self-pay | Admitting: *Deleted

## 2013-09-28 ENCOUNTER — Observation Stay (HOSPITAL_COMMUNITY)
Admission: RE | Admit: 2013-09-28 | Discharge: 2013-09-29 | Disposition: A | Discharge status: Home / Self Care | Payer: Managed Care, Other (non HMO) | Source: Ambulatory Visit | Attending: Surgery | Admitting: Surgery

## 2013-09-28 DIAGNOSIS — D351 Benign neoplasm of parathyroid gland: Secondary | ICD-10-CM

## 2013-09-28 DIAGNOSIS — E210 Primary hyperparathyroidism: Principal | ICD-10-CM | POA: Diagnosis present

## 2013-09-28 DIAGNOSIS — Z87442 Personal history of urinary calculi: Secondary | ICD-10-CM | POA: Diagnosis not present

## 2013-09-28 HISTORY — PX: PARATHYROIDECTOMY: SHX19

## 2013-09-28 LAB — CREATININE, SERUM
Creatinine, Ser: 0.67 mg/dL (ref 0.50–1.10)
GFR calc Af Amer: >90 mL/min (ref >90)

## 2013-09-28 LAB — CBC
HCT: 45.7 % (ref 36.0–46.0)
HEMOGLOBIN: 15.3 g/dL — AB (ref 12.0–15.0)
MCH: 29.5 pg (ref 26.0–34.0)
MCHC: 33.5 g/dL (ref 30.0–36.0)
MCV: 88.1 fL (ref 78.0–100.0)
Platelets: 193 10*3/uL (ref 150–400)
RBC: 5.19 MIL/uL — AB (ref 3.87–5.11)
RDW: 13.4 % (ref 11.5–15.5)
WBC: 6.8 10*3/uL (ref 4.0–10.5)

## 2013-09-28 LAB — PROTIME-INR
INR: 1.06 (ref 0.00–1.49)
Prothrombin Time: 13.8 seconds (ref 11.6–15.2)

## 2013-09-28 LAB — APTT: aPTT: 28 seconds (ref 24–37)

## 2013-09-28 SURGERY — PARATHYROIDECTOMY
Anesthesia: General | Site: Neck

## 2013-09-28 MED ORDER — ONDANSETRON HCL 4 MG PO TABS: 4.0000 mg | ORAL_TABLET | Freq: Four times a day (QID) | ORAL | Status: DC | PRN

## 2013-09-28 MED ORDER — ENOXAPARIN SODIUM 40 MG/0.4ML ~~LOC~~ SOLN
40.0000 mg | SUBCUTANEOUS | Status: DC
  Administered 2013-09-29: 40 mg via SUBCUTANEOUS
  Filled 2013-09-28 (×2): qty 0.4

## 2013-09-28 MED ORDER — MIDAZOLAM HCL 2 MG/2ML IJ SOLN
INTRAMUSCULAR | Status: AC
  Filled 2013-09-28: qty 2

## 2013-09-28 MED ORDER — FENTANYL CITRATE 0.05 MG/ML IJ SOLN
INTRAMUSCULAR | Status: AC
  Filled 2013-09-28: qty 5

## 2013-09-28 MED ORDER — DEXAMETHASONE SODIUM PHOSPHATE 4 MG/ML IJ SOLN
INTRAMUSCULAR | Status: AC
  Filled 2013-09-28: qty 1

## 2013-09-28 MED ORDER — ROCURONIUM BROMIDE 100 MG/10ML IV SOLN
INTRAVENOUS | Status: DC | PRN
  Administered 2013-09-28: 50 mg via INTRAVENOUS

## 2013-09-28 MED ORDER — FENTANYL CITRATE 0.05 MG/ML IJ SOLN
INTRAMUSCULAR | Status: DC | PRN
  Administered 2013-09-28: 100 ug via INTRAVENOUS
  Administered 2013-09-28: 50 ug via INTRAVENOUS

## 2013-09-28 MED ORDER — ACETAMINOPHEN 325 MG PO TABS: 325.0000 mg | ORAL_TABLET | ORAL | Status: DC | PRN

## 2013-09-28 MED ORDER — LACTATED RINGERS IV SOLN
INTRAVENOUS | Status: DC
  Administered 2013-09-28 (×2): via INTRAVENOUS

## 2013-09-28 MED ORDER — PROPOFOL 10 MG/ML IV BOLUS
INTRAVENOUS | Status: DC | PRN
  Administered 2013-09-28: 140 mg via INTRAVENOUS

## 2013-09-28 MED ORDER — ONDANSETRON HCL 4 MG/2ML IJ SOLN
INTRAMUSCULAR | Status: AC
  Filled 2013-09-28: qty 2

## 2013-09-28 MED ORDER — NEOSTIGMINE METHYLSULFATE 10 MG/10ML IV SOLN
INTRAVENOUS | Status: AC
  Filled 2013-09-28: qty 1

## 2013-09-28 MED ORDER — KETOROLAC TROMETHAMINE 15 MG/ML IJ SOLN
INTRAMUSCULAR | Status: AC
  Filled 2013-09-28: qty 1

## 2013-09-28 MED ORDER — GLYCOPYRROLATE 0.2 MG/ML IJ SOLN
INTRAMUSCULAR | Status: DC | PRN
  Administered 2013-09-28: 0.4 mg via INTRAVENOUS

## 2013-09-28 MED ORDER — KETOROLAC TROMETHAMINE 15 MG/ML IJ SOLN
15.0000 mg | Freq: Four times a day (QID) | INTRAMUSCULAR | Status: DC | PRN
Start: 2013-09-28 — End: 2013-09-29
  Administered 2013-09-28: 15 mg via INTRAVENOUS
  Filled 2013-09-28 (×2): qty 1

## 2013-09-28 MED ORDER — HEMOSTATIC AGENTS (NO CHARGE) OPTIME
TOPICAL | Status: DC | PRN
  Administered 2013-09-28: 1 via TOPICAL

## 2013-09-28 MED ORDER — LIDOCAINE HCL (CARDIAC) 20 MG/ML IV SOLN
INTRAVENOUS | Status: DC | PRN
  Administered 2013-09-28: 60 mg via INTRAVENOUS
  Administered 2013-09-28: 60 mg via INTRATRACHEAL

## 2013-09-28 MED ORDER — HYDROMORPHONE HCL PF 1 MG/ML IJ SOLN: 0.2500 mg | INTRAMUSCULAR | Status: DC | PRN

## 2013-09-28 MED ORDER — DEXAMETHASONE SODIUM PHOSPHATE 4 MG/ML IJ SOLN
INTRAMUSCULAR | Status: DC | PRN
  Administered 2013-09-28: 4 mg via INTRAVENOUS

## 2013-09-28 MED ORDER — 0.9 % SODIUM CHLORIDE (POUR BTL) OPTIME
TOPICAL | Status: DC | PRN
  Administered 2013-09-28: 1000 mL

## 2013-09-28 MED ORDER — ACETAMINOPHEN 160 MG/5ML PO SOLN
325.0000 mg | ORAL | Status: DC | PRN
  Filled 2013-09-28: qty 20.3

## 2013-09-28 MED ORDER — OXYCODONE-ACETAMINOPHEN 5-325 MG PO TABS
1.0000 | ORAL_TABLET | ORAL | Status: DC | PRN
  Administered 2013-09-28: 2 via ORAL
  Filled 2013-09-28: qty 2

## 2013-09-28 MED ORDER — OXYCODONE HCL 5 MG/5ML PO SOLN: 5.0000 mg | Freq: Once | ORAL | Status: DC | PRN

## 2013-09-28 MED ORDER — ONDANSETRON HCL 4 MG/2ML IJ SOLN
4.0000 mg | Freq: Four times a day (QID) | INTRAMUSCULAR | Status: DC | PRN
Start: 2013-09-28 — End: 2013-09-29

## 2013-09-28 MED ORDER — PHENYLEPHRINE 40 MCG/ML (10ML) SYRINGE FOR IV PUSH (FOR BLOOD PRESSURE SUPPORT)
PREFILLED_SYRINGE | INTRAVENOUS | Status: AC
  Filled 2013-09-28: qty 10

## 2013-09-28 MED ORDER — MIDAZOLAM HCL 5 MG/5ML IJ SOLN
INTRAMUSCULAR | Status: DC | PRN
  Administered 2013-09-28: 2 mg via INTRAVENOUS

## 2013-09-28 MED ORDER — ONDANSETRON HCL 4 MG/2ML IJ SOLN
INTRAMUSCULAR | Status: DC | PRN
  Administered 2013-09-28: 4 mg via INTRAVENOUS

## 2013-09-28 MED ORDER — NEOSTIGMINE METHYLSULFATE 10 MG/10ML IV SOLN
INTRAVENOUS | Status: DC | PRN
  Administered 2013-09-28: 3 mg via INTRAVENOUS

## 2013-09-28 MED ORDER — MORPHINE SULFATE 2 MG/ML IJ SOLN: 2.0000 mg | INTRAMUSCULAR | Status: DC | PRN

## 2013-09-28 MED ORDER — HYDROMORPHONE HCL PF 1 MG/ML IJ SOLN
0.2500 mg | INTRAMUSCULAR | Status: DC | PRN
Start: 2013-09-28 — End: 2013-09-28

## 2013-09-28 MED ORDER — GLYCOPYRROLATE 0.2 MG/ML IJ SOLN
INTRAMUSCULAR | Status: AC
  Filled 2013-09-28: qty 2

## 2013-09-28 MED ORDER — PHENYLEPHRINE HCL 10 MG/ML IJ SOLN
INTRAMUSCULAR | Status: DC | PRN
  Administered 2013-09-28: 80 ug via INTRAVENOUS

## 2013-09-28 MED ORDER — OXYCODONE HCL 5 MG PO TABS: 5.0000 mg | ORAL_TABLET | Freq: Once | ORAL | Status: DC | PRN

## 2013-09-28 SURGICAL SUPPLY — 52 items
ADH SKN CLS APL DERMABOND .7 (GAUZE/BANDAGES/DRESSINGS) ×1
APL SKNCLS STERI-STRIP NONHPOA (GAUZE/BANDAGES/DRESSINGS) ×1
BENZOIN TINCTURE PRP APPL 2/3 (GAUZE/BANDAGES/DRESSINGS) ×3 IMPLANT
BLADE SURG ROTATE 9660 (MISCELLANEOUS) IMPLANT
CANISTER SUCTION 2500CC (MISCELLANEOUS) ×3 IMPLANT
CHLORAPREP W/TINT 26ML (MISCELLANEOUS) ×3 IMPLANT
CLIP TI MEDIUM 6 (CLIP) ×3 IMPLANT
CLIP TI WIDE RED SMALL 24 (CLIP) ×3 IMPLANT
CLOSURE WOUND 1/2 X4 (GAUZE/BANDAGES/DRESSINGS) ×1
CONT SPEC 4OZ CLIKSEAL STRL BL (MISCELLANEOUS) IMPLANT
COVER SURGICAL LIGHT HANDLE (MISCELLANEOUS) ×3 IMPLANT
CRADLE DONUT ADULT HEAD (MISCELLANEOUS) ×3 IMPLANT
DERMABOND ADVANCED (GAUZE/BANDAGES/DRESSINGS) ×2
DERMABOND ADVANCED .7 DNX12 (GAUZE/BANDAGES/DRESSINGS) IMPLANT
DRAPE PED LAPAROTOMY (DRAPES) ×3 IMPLANT
DRAPE UTILITY 15X26 W/TAPE STR (DRAPE) ×6 IMPLANT
ELECT CAUTERY BLADE 6.4 (BLADE) ×3 IMPLANT
ELECT REM PT RETURN 9FT ADLT (ELECTROSURGICAL) ×3
ELECTRODE REM PT RTRN 9FT ADLT (ELECTROSURGICAL) ×1 IMPLANT
GAUZE SPONGE 2X2 8PLY STRL LF (GAUZE/BANDAGES/DRESSINGS) ×1 IMPLANT
GAUZE SPONGE 4X4 16PLY XRAY LF (GAUZE/BANDAGES/DRESSINGS) ×3 IMPLANT
GLOVE BIO SURGEON STRL SZ8 (GLOVE) ×3 IMPLANT
GLOVE BIOGEL PI IND STRL 8 (GLOVE) ×1 IMPLANT
GLOVE BIOGEL PI INDICATOR 8 (GLOVE) ×4
GLOVE ECLIPSE 8.0 STRL XLNG CF (GLOVE) ×2 IMPLANT
GOWN STRL REUS W/ TWL LRG LVL3 (GOWN DISPOSABLE) ×2 IMPLANT
GOWN STRL REUS W/ TWL XL LVL3 (GOWN DISPOSABLE) ×1 IMPLANT
GOWN STRL REUS W/TWL LRG LVL3 (GOWN DISPOSABLE) ×6
GOWN STRL REUS W/TWL XL LVL3 (GOWN DISPOSABLE) ×3
HEMOSTAT SNOW SURGICEL 2X4 (HEMOSTASIS) ×2 IMPLANT
HEMOSTAT SURGICEL 2X14 (HEMOSTASIS) IMPLANT
KIT BASIN OR (CUSTOM PROCEDURE TRAY) ×3 IMPLANT
KIT ROOM TURNOVER OR (KITS) ×3 IMPLANT
NS IRRIG 1000ML POUR BTL (IV SOLUTION) ×3 IMPLANT
PACK SURGICAL SETUP 50X90 (CUSTOM PROCEDURE TRAY) ×3 IMPLANT
PAD ARMBOARD 7.5X6 YLW CONV (MISCELLANEOUS) ×6 IMPLANT
PENCIL BUTTON HOLSTER BLD 10FT (ELECTRODE) ×3 IMPLANT
SPONGE GAUZE 2X2 STER 10/PKG (GAUZE/BANDAGES/DRESSINGS) ×2
SPONGE INTESTINAL PEANUT (DISPOSABLE) ×3 IMPLANT
STRIP CLOSURE SKIN 1/2X4 (GAUZE/BANDAGES/DRESSINGS) ×2 IMPLANT
SUT MNCRL AB 4-0 PS2 18 (SUTURE) ×2 IMPLANT
SUT SILK 3 0 (SUTURE) ×3
SUT SILK 3-0 18XBRD TIE 12 (SUTURE) ×1 IMPLANT
SUT VIC AB 3-0 SH 18 (SUTURE) ×3 IMPLANT
SUT VICRYL 3 0 TIE (SUTURE) IMPLANT
SUT VICRYL 4-0 PS2 18IN ABS (SUTURE) ×3 IMPLANT
SYR BULB 3OZ (MISCELLANEOUS) ×5 IMPLANT
TOWEL OR 17X24 6PK STRL BLUE (TOWEL DISPOSABLE) ×3 IMPLANT
TOWEL OR 17X26 10 PK STRL BLUE (TOWEL DISPOSABLE) ×3 IMPLANT
TUBE CONNECTING 12'X1/4 (SUCTIONS) ×1
TUBE CONNECTING 12X1/4 (SUCTIONS) ×2 IMPLANT
WATER STERILE IRR 1000ML POUR (IV SOLUTION) IMPLANT

## 2013-09-28 NOTE — Brief Op Note (Signed)
09/28/2013  2:42 PM  PATIENT:  Janalyn Harder Mesch  53 y.o. female  PRE-OPERATIVE DIAGNOSIS:  primary hyperparathyroidism  POST-OPERATIVE DIAGNOSIS:  primary hyperparathyroidism  PROCEDURE:  Procedure(s): PARATHYROIDECTOMY (N/A)  SURGEON:  Surgeon(s) and Role:    * Breaunna Gottlieb A. Jachai Okazaki, MD - Primary    * Adin Hector, MD - Assisting      ANESTHESIA:   general  EBL:  Total I/O In: 800 [I.V.:800] Out: 50 [Blood:50]  BLOOD ADMINISTERED:none    LOCAL MEDICATIONS USED:  NONE  SPECIMEN:  Source of Specimen:  leftinferior parathyroid gland  DISPOSITION OF SPECIMEN:  PATHOLOGY  COUNTS:  YES  TOURNIQUET:  * No tourniquets in log *  DICTATION: .Other Dictation: Dictation Number [id number]  PLAN OF CARE: Admit for overnight observation  PATIENT DISPOSITION:  PACU - hemodynamically stable.   Delay start of Pharmacological VTE agent (>24hrs) due to surgical blood loss or risk of bleeding: no

## 2013-09-28 NOTE — Transfer of Care (Signed)
Immediate Anesthesia Transfer of Care Note  Patient: [name]  Procedure(s) Performed: Procedure(s): PARATHYROIDECTOMY (N/A)  Patient Location: PACU  Anesthesia Type:General  Level of Consciousness: awake, alert , oriented and patient cooperative  Airway & Oxygen Therapy: Patient Spontanous Breathing and Patient connected to nasal cannula oxygen  Post-op Assessment: Report given to PACU RN, Post -op Vital signs reviewed and stable and Patient moving all extremities  Post vital signs: Reviewed and stable  Complications: No apparent anesthesia complications

## 2013-09-28 NOTE — H&P (Signed)
Transplants    None    Demographics [name] 53 year old female  [place]  [place] [address] [phone] (W)  Comm Pref: None [place] [address] [phone] (W) Works at [employer]  Problem ListHospitalization ProblemNon-Hospital  Hypercalcemia  Primary hyperparathyroidism  Significant History/Details  Smoking: Never Smoker   Smokeless Tobacco: Unknown  Alcohol: 1.2 oz alcohol/week  1 open order  Preferred Language: English  Dialysis HistoryNone   Currently admitted as of 8/13/2015Specialty CommentsEditShow AllReport5/4/15 pt signed phi for husband, michael Caspers, dob 06/10/[birth year] - bbk 08-31-13 [employer] plan deductible 300.00/0.00 remaining, OOP 2500.00/1841.57 remaining, 10% co-ins. tvp 08-31-13 pt OPB sx schd 0-93-26 @ [place] no precert required per automation ref#[id number].(skm,tvp) DOS 09/28/13 [facility]-OPB-parathyroidectomy, 09/14/13, [address], skm   MedicationsHospital Medications  ceFAZolin (ANCEF) IVPB 2 g/50 mL premix  chlorhexidine (HIBICLENS) 4 % liquid 1 application    Preferred Labs   None   Transplant-Related Biopsies (11 years) ** None **  Patient Blood Type (50 years)   None                                 Recent Visits (Maximum of 10 visits)Date Type Provider Description  09/28/2013 Surgery Kristof Nadeem A., MD   08/28/2013 Office Visit Erroll Luna A., MD Primary Hyperparathyroidism (Primary Dx)  08/08/2013 Telephone Carlene Coria, [place]   06/19/2013 Office Visit Erroll Luna A., MD Hypercalcemia (Primary Dx)         My Last Outpatient Progress NoteStatus Last Edited Encounter Date  Signed Mon Aug 28, 2013 3:08 PM EDT 08/28/2013  Subjective:      Patient ID: [name], female   DOB: Sep 25, 1960, 53 y.o.   MRN: [id number]   HPI Patient returns after sestamibi scan. No evidence of parathyroid activity noted.  Ultrasound showed a 1.7 cm mass just adjacent to the inferior pole left thyroid lobe consistent with adenoma. She has history of kidney stones, elevated calcium and elevated PTH.   Review of Systems  HENT: Negative.   Cardiovascular: Negative.   Genitourinary: Positive for hematuria.      Objective:    Physical Exam  Constitutional: She appears well-developed and well-nourished.  Neck: Normal range of motion. Neck supple. No thyromegaly present.  Lymphadenopathy:    She has no cervical adenopathy.  Skin: Skin is warm and dry.  Psychiatric: She has a normal mood and affect. Her behavior is normal. Judgment and thought content normal.  CLINICAL DATA: Parathyroid adenoma   EXAM:   THYROID ULTRASOUND   TECHNIQUE:   Ultrasound examination of the thyroid gland and adjacent soft   tissues was performed.   COMPARISON: None.   FINDINGS:   Right thyroid lobe   Measurements: 3.3 x 1.3 x 1.6 cm. Tiny 3 mm hypoechoic solid nodule   in the superior gland.   Left thyroid lobe   Measurements: 3.8 x 1.4 x 1.2 cm. Circumscribed, homogeneous 5 x 4 x   4 mm solid hypoechoic nodule in the superior aspect of the gland.   Macro lobulated approximately 1.7 x 0.9 x 1.1 cm hypoechoic solid   mass noted adjacent to the inferior tip of the left gland.   Isthmus   Thickness: 0.2 cm. No nodules visualized.   Lymphadenopathy   None visualized.   IMPRESSION:   1. There is a 1.7 cm macro lobulated hypoechoic mass adjacent  to the   inferior pole of the left thyroid gland. Differential considerations   include exophytic thyroid nodule and parathyroid nodule/adenoma.   Consider nuclear medicine sestamibi scan for correlation.   2. Tiny sub-cm hypoechoic solid nodules, 1 each in the upper aspect   of the right and left side of the gland. Findings do not meet   current SRU consensus criteria for biopsy. Follow-up by clinical   exam is recommended. If patient has known risk factors for thyroid   carcinoma,  consider follow-up ultrasound in 12 months. If patient is   clinically hyperthyroid, consider nuclear medicine thyroid uptake   and scan. Reference: Management of Thyroid Nodules Detected at [place]:   Society of Radiologists in [place]. Radiology 2005; [id number].   Electronically Signed   By: Jacqulynn Cadet M.D.   On: 06/22/2013 12:17   CMP        Component  Value  Date/Time     NA  145  03/10/2013 1005     K  4.4  03/10/2013 1005     CL  106  03/10/2013 1005     CO2  25  03/10/2013 1005     GLUCOSE  106*  03/10/2013 1005     BUN  13  03/10/2013 1005     CREATININE  0.70  03/10/2013 1005     CALCIUM  11.7*  03/10/2013 1005     PROT  7.7  03/10/2013 1005     ALBUMIN  4.7  03/10/2013 1005     AST  42*  03/10/2013 1005     ALT  90*  03/10/2013 1005     ALKPHOS  109  03/10/2013 1005     BILITOT  0.6  03/10/2013 1005     GFRNONAA  >90  03/10/2013 1005     GFRAA  >90  03/10/2013 1005        Assessment:    Primary hyperparathyroidism   Plan:    Unfortunately she did not localized by sestamibi. My suspicion side she has primary hyperparathyroidism and ultrasound verifies what appears to be a large inferior left gland. Discussed this with the patient. Discussed neck exploration and evaluation of all 4 glands. Risk of bleeding, infection, surgical failure to identify glands, nerve injury, injury to adjacent structures, low calcium requiring replacement, and failure rates to find the gland at about 5%. She understands and wishes to proceed.

## 2013-09-28 NOTE — Discharge Instructions (Signed)
Parathyroidectomy °A parathyroidectomy is surgery to remove one or more parathyroid glands. These glands produce a hormone (parathyroid hormone) that helps control the level of calcium in your body. The glands are very small, about the size of a pea. They are located in your neck, close to your thyroid gland and your Adam's apple. Most people (85%) have four parathyroid glands,some people may have one or two more than that. °Hyperparathyroidism is when too much parathyroid hormone is being produced. Usually this is caused by one of the parathyroid glands becoming enlarged, but it can also be caused by more than one of the glands. Hyperparathyroidism is found during blood tests that show high calcium in the blood. Parathyroid hormone levels will also be elevated. Cancer also can cause hyperparathyroidism, but this is rare. For the most common type of hyperparathyroidism, the treatment is surgical removal of the parathyroid gland that is enlarged. For patients with kidney failure and hyperparathyroidism, other treatment will be tried before surgery is done on the parathyroid.  °Many times x-ray studies are done to find out which parathyroid gland or glands is malfunctioning. The decision about the best treatment for hyperparathyroidism is between the patient, their primary doctor, an endocrinologist, and a surgeon experienced in parathyroid surgery. °LET YOUR CAREGIVER KNOW ABOUT: °· Any allergies. °· All medications you are taking, including: °¨ Herbs, eyedrops, over-the-counter medications and creams. °¨ Blood thinners (anticoagulants), aspirin or other drugs that could affect blood clotting. °· Use of steroids (by mouth or as creams). °· Previous problems with anesthetics, including local anesthetics. °· Possibility of pregnancy, if this applies. °· Any history of blood clots. °· Any history of bleeding or other blood problems. °· Previous surgery. °· Smoking history. °· Other health problems. °RISKS AND  COMPLICATIONS  °· Short-term possibilities include: °¨ Excessive bleeding. °¨ Pain. °¨ Infection near the incision. °¨ Slow healing. °¨ Pooling of blood under the wound (hematoma). °¨ Damage to nerves in your neck. °¨ Blood clots. °¨ Difficulty breathing. This is very rare. It also is almost always temporary. °· Longer-term possibilities include: °¨ Scarring. °¨ Skin damage. °¨ Damage to blood vessels in the area. °¨ Need for additional surgery. °¨ A hoarse or weak voice. This is usually temporary. It can be the result of nerve damage. °¨ Development of hypoparathyroidism. This means you are not making enough parathyroid hormone. It is rare. If it occurs, you will need to take calcium supplements daily. °BEFORE THE PROCEDURE °· Sometimes the surgery is done on an outpatient basis. This means you could go home the same day as your surgery. Other times, people need to stay in the hospital overnight. Ask your surgeon what you should expect. °· If your surgery will be an outpatient procedure, arrange for someone to drive you home after the surgery. °· Two weeks before your surgery, stop using aspirin and non-steroidal anti-inflammatory drugs (NSAID's) for pain relief. This includes prescription drugs and over-the-counter drugs such as ibuprofen and naproxen. Also stop taking vitamin E. °· If you take blood-thinners, ask your healthcare provider when you should stop taking them. °· Do not eat or drink for about 8 hours before your surgery. °· You might be asked to shower or wash with a special antibacterial soap before the procedure. °· Arrive at least an hour before the surgery, or whenever your surgeon recommends. This will give you time to check in and fill out any needed paperwork. °PROCEDURE °· The preparation: °¨ You will change into a hospital gown. °¨ You   will be given an IV. A needle will be inserted in your arm. Medication will be able to flow directly into your body through this needle. °¨ You might be given a  sedative to help you relax. °¨ You will be given a drug that puts you to sleep during the surgery (general anesthetic). °· The procedure: °¨ Once you are asleep, the surgeon will make a small cut (incision) in your lower neck. Ask your surgeon where the incision will be. °¨ The surgeon will look for the gland(s) that are not working well. Often a tissue sample from a gland is used to determine this. °¨ Any glands that are not working well will be removed. °¨ The surgeon will close the incision with stitches, often these are hidden under the skin. °AFTER THE PROCEDURE °· You will stay in a recovery area until the anesthesia has worn off. Your blood pressure and heart rate will be checked. °· If your surgery was an outpatient procedure, you will go home the same day. °· If you need to stay in the hospital, you will be moved to a hospital room. You will probably stay for two to three days. This will depend on how quickly you recover. °· While you are in the hospital, your blood will be tested to check the calcium levels in your body. °HOME CARE INSTRUCTIONS  °· Take any medication that your surgeon prescribes. Follow the directions carefully. Take all of the medication. °· Ask your surgeon whether you can take over-the-counter medicines for pain, discomfort or fever. Do not take aspirin without permission from the surgeon. Aspirin increases the chances of bleeding. °· Do not get the wound wet for the first few days after surgery (or until the surgeon tells you it is OK). °· After this procedure, many patients may develop low calcium levels in the blood. It is critical that you see your medical caregiver to have this monitored and managed. °·  °·  °SEEK MEDICAL CARE IF:  °· You notice blood or fluid leaking from the wound, or it becomes red or swollen. °· You have trouble breathing. °· You have trouble speaking. °· You become nauseous or throw up for more than two days after the surgery. °· You have a fever or  persistent symptoms for more than 2-3 days. °SEEK IMMEDIATE MEDICAL CARE IF:  °· Breathing becomes more difficult. °· You have a fever and your symptoms suddenly get worse. °Document Released: 05/01/2008 Document Revised: 01/20/2012 Document Reviewed: 05/01/2008 °ExitCare® Patient Information ©2015 [employer]. This information is not intended to replace advice given to you by your health care provider. Make sure you discuss any questions you have with your health care provider. ° °

## 2013-09-28 NOTE — Interval H&P Note (Signed)
History and Physical Interval Note:  09/28/2013 12:44 PM  [name]  has presented today for surgery, with the diagnosis of primary hyperparathyroidsm  The various methods of treatment have been discussed with the patient and family. After consideration of risks, benefits and other options for treatment, the patient has consented to  Procedure(s): PARATHYROIDECTOMY (N/A) as a surgical intervention .  The patient's history has been reviewed, patient examined, no change in status, stable for surgery.  I have reviewed the patient's chart and labs.  Questions were answered to the patient's satisfaction.     Darcel Frane A.

## 2013-09-28 NOTE — Anesthesia Procedure Notes (Signed)
Procedure Name: Intubation Date/Time: 09/28/2013 1:30 PM Performed by: Julian Reil Pre-anesthesia Checklist: Patient identified, Emergency Drugs available, Suction available and Patient being monitored Patient Re-evaluated:Patient Re-evaluated prior to inductionOxygen Delivery Method: Circle system utilized Preoxygenation: Pre-oxygenation with 100% oxygen Intubation Type: IV induction Ventilation: Mask ventilation without difficulty Laryngoscope Size: Mac and 3 Grade View: Grade II Tube type: Oral Tube size: 7.0 mm Number of attempts: 1 Airway Equipment and Method: Stylet Placement Confirmation: ETT inserted through vocal cords under direct vision,  positive ETCO2 and breath sounds checked- equal and bilateral Secured at: 22 cm Tube secured with: Tape Dental Injury: Teeth and Oropharynx as per pre-operative assessment

## 2013-09-28 NOTE — Anesthesia Preprocedure Evaluation (Signed)
Anesthesia Evaluation  Patient identified by MRN, date of birth, ID band Patient awake    Reviewed: Allergy & Precautions, H&P , NPO status , Patient's Chart, lab work & pertinent test results  History of Anesthesia Complications Negative for: history of anesthetic complications  Airway Mallampati: II TM Distance: >3 FB Neck ROM: Full    Dental  (+) Teeth Intact   Pulmonary neg pulmonary ROS,  breath sounds clear to auscultation        Cardiovascular negative cardio ROS  Rhythm:Regular     Neuro/Psych negative neurological ROS  negative psych ROS   GI/Hepatic negative GI ROS, Neg liver ROS,   Endo/Other  Primary hyperparathyroidism  Renal/GU negative Renal ROS     Musculoskeletal   Abdominal   Peds  Hematology negative hematology ROS (+)   Anesthesia Other Findings   Reproductive/Obstetrics                           Anesthesia Physical Anesthesia Plan  ASA: II  Anesthesia Plan: General   Post-op Pain Management:    Induction: Intravenous  Airway Management Planned: Oral ETT  Additional Equipment: None  Intra-op Plan:   Post-operative Plan: Extubation in OR  Informed Consent: I have reviewed the patients History and Physical, chart, labs and discussed the procedure including the risks, benefits and alternatives for the proposed anesthesia with the patient or authorized representative who has indicated his/her understanding and acceptance.   Dental advisory given  Plan Discussed with: CRNA and Surgeon  Anesthesia Plan Comments:         Anesthesia Quick Evaluation

## 2013-09-29 ENCOUNTER — Encounter (HOSPITAL_COMMUNITY): Payer: Self-pay | Admitting: Surgery

## 2013-09-29 DIAGNOSIS — E210 Primary hyperparathyroidism: Secondary | ICD-10-CM | POA: Diagnosis not present

## 2013-09-29 LAB — COMPREHENSIVE METABOLIC PANEL
ALBUMIN: 3.8 g/dL (ref 3.5–5.2)
ALT: 71 U/L — AB (ref 0–35)
AST: 36 U/L (ref 0–37)
Alkaline Phosphatase: 111 U/L (ref 39–117)
Anion gap: 12 (ref 5–15)
BUN: 11 mg/dL (ref 6–23)
CALCIUM: 9.6 mg/dL (ref 8.4–10.5)
CO2: 25 mEq/L (ref 19–32)
Chloride: 106 mEq/L (ref 96–112)
Creatinine, Ser: 0.64 mg/dL (ref 0.50–1.10)
GFR calc Af Amer: >90 mL/min (ref >90)
GFR calc non Af Amer: >90 mL/min (ref >90)
Glucose, Bld: 133 mg/dL — ABNORMAL HIGH (ref 70–99)
Potassium: 4.7 mEq/L (ref 3.7–5.3)
Sodium: 143 mEq/L (ref 137–147)
Total Bilirubin: 0.4 mg/dL (ref 0.3–1.2)
Total Protein: 6.6 g/dL (ref 6.0–8.3)

## 2013-09-29 LAB — PTH, INTACT AND CALCIUM: CALCIUM TOTAL (PTH): 11.3 mg/dL — AB (ref 8.4–10.5)

## 2013-09-29 MED ORDER — OXYCODONE-ACETAMINOPHEN 5-325 MG PO TABS: 1.0000 | ORAL_TABLET | Freq: Four times a day (QID) | ORAL | Status: DC | PRN

## 2013-09-29 NOTE — Op Note (Signed)
[name], [name] NO.:  [id number]  MEDICAL RECORD NO.:  [id number]  LOCATION:  6N29C                        FACILITY:  [facility]  PHYSICIAN:  Marcello Moores A. Kaleeya Hancock, M.D.DATE OF BIRTH:  03-23-1960  DATE OF PROCEDURE:  09/28/2013 DATE OF DISCHARGE:                              OPERATIVE REPORT   PREOPERATIVE DIAGNOSIS:  Primary hyperparathyroidism.  POSTOPERATIVE DIAGNOSIS:  Primary hyperparathyroidism secondary to a left inferior 600 g hypercellular parathyroid gland.  PROCEDURE:  Parathyroidectomy.  SURGEON:  Marcello Moores A. Maijor Hornig, M.D.  ANESTHESIA:  General endotracheal anesthesia with 0.25% Sensorcaine local.  EBL:  Minimal.  SPECIMEN:  Left parathyroid gland by frozen section was hypercellular with tissue consistent with parathyroid adenoma, measures 600 g.  DRAINS:  None.  IV FLUIDS:  500 mL crystalloid.  INDICATIONS FOR PROCEDURE:  The patient is a pleasant 53 year old female with history of kidney stones.  Workup showed elevated PTH as well as serum calcium on the 11 range.  Urinary calcium was also elevated as well.  She had symptoms consistent with primary hyperparathyroidism and was symptomatic.  We discussed both medical and surgical options of treatment.  She wished to undergo parathyroidectomy.  Risk of bleeding, infection, recurrent laryngeal nerve injury, hoarseness, rebound, low calcium, the potential for the procedure, not treating her hypercalcemia, the need for other operative procedures down the road, injury to major blood vessels, injury to major nerves, airway compromise requiring tracheostomy and death.  After discussion of the above, she agreed to proceed.  DESCRIPTION OF PROCEDURE:  The patient was met in the holding area. Questions were answered.  She was then taken back to the operating room and placed supine on the OR table.  After induction of general endotracheal anesthesia, the neck was prepped and draped in sterile fashion  with a roll under her shoulders and both arms tucked.  Time-out was done to verify correct patient and correct procedure as well as receiving 2 g of Ancef.  A transverse cervical incision was made 2 fingerbreadths above the sternal notch.  Dissection was carried down through the platysma muscle, and both the superior and inferior platysmal flaps were raised.  Strap muscles were divided in the midline and retracted laterally.  The left thyroid lobe was quite small, but was identified.  By palpation, I felt a 2-cm mass along the inferior pole, corresponded to a preoperative ultrasound, which showed a 1.7-cm mass consistent with possible parathyroid adenoma.  Her sestamibi did not localize at this side, but this was indeed quiet and large.  We then carefully dissected out the mass all the way down to the pedicle of the mass and clipped this.  The tissue did appear to be parathyroid adenoma by its appearance.  This was sent for frozen section analysis and was a hypercellular gland consistent with parathyroid adenoma for pathology. It weighed 600 g.  Hemostasis was achieved.  We were well away from the left recurrent laryngeal nerve.  I did not do a full neck exploration given the large size of this gland, given its weight, its pathological composition and preoperative imaging.  I then closed the strap muscles with 3-0 Vicryl and the platysma with 3-0 Vicryl.  A 4-0 Monocryl was used to close the skin  in subcuticular fashion.  Dermabond was applied. All final counts of sponge, needle, instruments were found to be correct.  The patient was extubated and taken to the recovery in satisfactory condition.     Malaka Ruffner A. Makayla Confer, M.D.     TAC/MEDQ  D:  09/28/2013  T:  09/29/2013  Job:  [id number]

## 2013-09-29 NOTE — Anesthesia Postprocedure Evaluation (Signed)
  Anesthesia Post-op Note  Patient: [name]  Procedure(s) Performed: Procedure(s): PARATHYROIDECTOMY (N/A)  Patient Location: PACU  Anesthesia Type:General  Level of Consciousness: awake  Airway and Oxygen Therapy: Patient Spontanous Breathing  Post-op Pain: none  Post-op Assessment: Post-op Vital signs reviewed, Patient's Cardiovascular Status Stable, Respiratory Function Stable, Patent Airway, No signs of Nausea or vomiting and Pain level controlled  Post-op Vital Signs: Reviewed and stable  Last Vitals:  Filed Vitals:   09/29/13 0517  BP: 133/74  Pulse: 81  Temp: 36.6 C  Resp: 17    Complications: No apparent anesthesia complications

## 2013-09-29 NOTE — Progress Notes (Signed)
Agree with above 

## 2013-09-29 NOTE — Progress Notes (Signed)
AVS discharge instructions were given and went over with patient. Patient was also given prescription for percocet to take to her pharmacy. Patient stated that she did not have any questions. Patient ambulated with her husband to their transportation.

## 2013-09-29 NOTE — Progress Notes (Signed)
[facility] Progress Note  1 Day Post-Op  Subjective: Pt looks fantastic today.  She's up OOB walking through the halls.  Having very minimal pain at her anterior neck/incision site.  No N/V, no numbness or tingling, no SOB/CP, no trouble swallowing, no hoarseness.  C/o some phlegm production.  Tolerating diet well.  Having flatus and urinating well.  Pt states she has good ROM of her neck.  Wonders if she can go to work and drive sooner since she feels so great.  Objective: Vital signs in last 24 hours: Temp:  [97.6 F (36.4 C)-98.4 F (36.9 C)] 97.8 F (36.6 C) (08/14 0517) Pulse Rate:  [71-108] 81 (08/14 0517) Resp:  [16-21] 17 (08/14 0517) BP: (115-171)/(60-98) 133/74 mmHg (08/14 0517) SpO2:  [88 %-98 %] 95 % (08/14 0517) Weight:  [188 lb 3 oz (85.36 kg)-188 lb 3.2 oz (85.367 kg)] 188 lb 3 oz (85.36 kg) (08/13 1606) Last BM Date: 09/28/13  Intake/Output from previous day: 08/13 0701 - 08/14 0700 In: 1160 [P.O.:360; I.V.:800] Out: 1050 [Urine:1000; Blood:50] Intake/Output this shift:    PE: Gen:  Alert, NAD, pleasant, voice clear, A&O x3 Neck:  Anterior neck incision C/D/I with dermabond in place, minimal tenderness, no erythema, induration, or fluctuance.  Near full ROM of neck. Card:  RRR, no M/G/R heard Pulm:  CTA, no W/R/R Abd: Soft, NT/ND, +BS, no HSM Ext:  No numbness or tingling   Lab Results:   Recent Labs  09/28/13 1830  WBC 6.8  HGB 15.3*  HCT 45.7  PLT 193   BMET  Recent Labs  09/28/13 1830 09/29/13 0506  NA  --  143  K  --  4.7  CL  --  106  CO2  --  25  GLUCOSE  --  133*  BUN  --  11  CREATININE 0.67 0.64  CALCIUM  --  9.6   PT/INR  Recent Labs  09/28/13 1234  LABPROT 13.8  INR 1.06   CMP     Component Value Date/Time   NA 143 09/29/2013 0506   K 4.7 09/29/2013 0506   CL 106 09/29/2013 0506   CO2 25 09/29/2013 0506   GLUCOSE 133* 09/29/2013 0506   BUN 11 09/29/2013 0506   CREATININE 0.64 09/29/2013 0506   CALCIUM 9.6  09/29/2013 0506   PROT 6.6 09/29/2013 0506   ALBUMIN 3.8 09/29/2013 0506   AST 36 09/29/2013 0506   ALT 71* 09/29/2013 0506   ALKPHOS 111 09/29/2013 0506   BILITOT 0.4 09/29/2013 0506   GFRNONAA >90 09/29/2013 0506   GFRAA >90 09/29/2013 0506   Lipase  No results found for this basename: lipase       Studies/Results: No results found.  Anti-infectives: Anti-infectives   Start     Dose/Rate Route Frequency Ordered Stop   09/28/13 0600  ceFAZolin (ANCEF) IVPB 2 g/50 mL premix     2 g 100 mL/hr over 30 Minutes Intravenous On call to O.R. 09/27/13 1446 09/28/13 1337       Assessment/Plan Primary hyperparathyroidism 2* left inferior 600g hypercellular parathyroid gland POD #1 s/p Parathyroidectomy  Plan: 1.  The patient looks fantastic.  Tolerating diet, calcium is normal, no post-op complications apparent.  Will plan on d/c today with PO pain meds 2.  Follow up with Dr. Brantley Stage as previously scheduled 10/13/13 at 9:00am 3.  Encouraged ambulation and IS     LOS: 1 day    [name], [name] 09/29/2013, 8:14 AM Pager: [phone]

## 2013-10-04 NOTE — Discharge Summary (Signed)
Physician Discharge Summary  Patient ID: [name] MRN: [id number] DOB/AGE: February 03, 1961 53 y.o.  Admit date: 09/28/2013 Discharge date: 10/04/2013  Admission Diagnoses: primary hyperparathyroidism  Discharge Diagnoses:  Active Problems:   Hyperparathyroidism, primary   Discharged Condition: good  Hospital Course: pt did well.  No immediate surgical complications and calcium 9.3 at discharge and PTH 5.5.  Consults: None  Significant Diagnostic Studies: CMP     Component Value Date/Time   NA 143 09/29/2013 0506   K 4.7 09/29/2013 0506   CL 106 09/29/2013 0506   CO2 25 09/29/2013 0506   GLUCOSE 133* 09/29/2013 0506   BUN 11 09/29/2013 0506   CREATININE 0.64 09/29/2013 0506   CALCIUM 9.6 09/29/2013 0506   CALCIUM 11.3* 09/28/2013 1830   PROT 6.6 09/29/2013 0506   ALBUMIN 3.8 09/29/2013 0506   AST 36 09/29/2013 0506   ALT 71* 09/29/2013 0506   ALKPHOS 111 09/29/2013 0506   BILITOT 0.4 09/29/2013 0506   GFRNONAA >90 09/29/2013 0506   GFRAA >90 09/29/2013 0506      Treatments: surgery: PARATHYROIDECTOMY  Discharge Exam: Blood pressure 133/74, pulse 81, temperature 97.8 F (36.6 C), temperature source Oral, resp. rate 17, height 5\' 2"  (1.575 m), weight 188 lb 3 oz (85.36 kg), SpO2 95.00%. Incision/Wound:c/d/i  NO HEMATOMA  Disposition: 01-Home or Self Care     Medication List         oxyCODONE-acetaminophen 5-325 MG per tablet  Commonly known as:  PERCOCET/ROXICET  Take 1-2 tablets by mouth every 6 (six) hours as needed for moderate pain.           Follow-up Information   Follow up with Jaylee Lantry A., MD In 3 weeks.   Specialty:  General Surgery   Contact information:   [address] [place] [address] [phone]       Signed: Turner Daniels. 10/04/2013, 7:50 AM

## 2013-10-09 ENCOUNTER — Encounter (INDEPENDENT_AMBULATORY_CARE_PROVIDER_SITE_OTHER): Payer: Self-pay | Admitting: Surgery

## 2013-10-09 ENCOUNTER — Ambulatory Visit (INDEPENDENT_AMBULATORY_CARE_PROVIDER_SITE_OTHER): Payer: Managed Care, Other (non HMO) | Admitting: Surgery

## 2013-10-09 VITALS — BP 122/80 | HR 75 | Temp 97.5°F | Ht 62.0 in | Wt 187.0 lb

## 2013-10-09 DIAGNOSIS — Z9889 Other specified postprocedural states: Secondary | ICD-10-CM

## 2013-10-09 NOTE — Progress Notes (Signed)
The patient returns 2 weeks after parathyroidectomy. Pathology showed parathyroid adenoma and PTH level dropped to 5.5 .  Calcium normalized. Postop calcium was 9.3.  Exam: Incision clean dry and intact. No signs of infection. Very strong.  Impression: Primary hyperparathyroidism status post parathyroidectomy  Plan: Resume full activity. Followup as needed. Since PTH level undetectable postop and having no symptoms of hypocalcemia check calcium level next month during annual physical

## 2013-10-09 NOTE — Patient Instructions (Signed)
Resume full activity. Return as needed.  

## 2013-10-13 ENCOUNTER — Encounter (INDEPENDENT_AMBULATORY_CARE_PROVIDER_SITE_OTHER): Payer: Managed Care, Other (non HMO) | Admitting: Surgery

## 2014-05-08 ENCOUNTER — Ambulatory Visit (INDEPENDENT_AMBULATORY_CARE_PROVIDER_SITE_OTHER): Payer: Managed Care, Other (non HMO) | Admitting: Medical

## 2014-05-08 ENCOUNTER — Other Ambulatory Visit: Payer: Self-pay

## 2014-05-08 ENCOUNTER — Encounter: Payer: Self-pay | Admitting: Medical

## 2014-05-08 VITALS — BP 152/79 | HR 97 | Temp 98.1°F | Ht 62.75 in | Wt 191.6 lb

## 2014-05-08 DIAGNOSIS — G5602 Carpal tunnel syndrome, left upper limb: Secondary | ICD-10-CM | POA: Diagnosis not present

## 2014-05-08 DIAGNOSIS — L309 Dermatitis, unspecified: Secondary | ICD-10-CM | POA: Insufficient documentation

## 2014-05-08 DIAGNOSIS — N200 Calculus of kidney: Secondary | ICD-10-CM | POA: Diagnosis not present

## 2014-05-08 DIAGNOSIS — G5600 Carpal tunnel syndrome, unspecified upper limb: Secondary | ICD-10-CM | POA: Insufficient documentation

## 2014-05-08 DIAGNOSIS — G5603 Carpal tunnel syndrome, bilateral upper limbs: Secondary | ICD-10-CM

## 2014-05-08 DIAGNOSIS — G5601 Carpal tunnel syndrome, right upper limb: Secondary | ICD-10-CM | POA: Diagnosis not present

## 2014-05-08 MED ORDER — DOXYCYCLINE HYCLATE 100 MG PO TABS: 100.0000 mg | ORAL_TABLET | Freq: Two times a day (BID) | ORAL | Status: DC

## 2014-05-08 MED ORDER — PREDNISONE 20 MG PO TABS: ORAL_TABLET | ORAL | Status: DC

## 2014-05-08 MED ORDER — HYDROXYZINE HCL 25 MG PO TABS: 25.0000 mg | ORAL_TABLET | Freq: Three times a day (TID) | ORAL | Status: DC | PRN

## 2014-05-08 MED ORDER — METHYLPREDNISOLONE ACETATE 40 MG/ML IJ SUSP
40.0000 mg | Freq: Once | INTRAMUSCULAR | Status: AC
  Administered 2014-05-08: 40 mg via INTRAMUSCULAR

## 2014-05-08 MED ORDER — AMMONIUM LACTATE 12 % EX CREA: TOPICAL_CREAM | CUTANEOUS | Status: DC

## 2014-05-08 NOTE — Assessment & Plan Note (Signed)
Rt side now much much better since surgery. Left side still present. Surgery date is pending.

## 2014-05-08 NOTE — Progress Notes (Signed)
Pre visit review using our clinic review tool, if applicable. No additional management support is needed unless otherwise documented below in the visit note. 

## 2014-05-08 NOTE — Assessment & Plan Note (Addendum)
Cause is undetermined. Hx of contact dermatitis. Will give depo medrol Im. Also give tapered dose of prednisone. Hydroxyzine for the itch.  Rx of hydroxyzine for the itching.   If any worse redness or tenderness while on above then start doxycycline.  If rash resolves but then returns then can refer to allergist.  Follow 1-2 weeks or as needed.

## 2014-05-08 NOTE — Patient Instructions (Addendum)
Dermatitis Cause is undetermined. Hx of contact dermatitis. Will give depo medrol Im. Also give tapered dose of prednisone. Hydroxyzine for the itch.  Rx of hydroxyzine for the itching.   If any worse redness or tenderness while on above then start doxycycline.  If rash resolves but then returns then can refer to allergist.  Follow 1-2 weeks or as needed.   Lac-hydrin moisturizer called in as well.  Also encouraged wellness exam. And at that time would recheck her bp . Will recheck bp when if in 2wks as well.

## 2014-05-08 NOTE — Progress Notes (Signed)
Subjective:    Patient ID: [name], female    DOB: 1960/12/12, 54 y.o.   MRN: [id number]  HPI    I have reviewed pt PMH, PSH, FH, Social History and Surgical History   Pt has kidney stone. Related to hyperthyroidism. Stone last in January. Found CA level up. Pt has parthyroidism(overactive). She had removed. No kidney stones since.  Pt has cts in January both sides. Rt side surgery. Lt side surgery is pending. Some residual symptoms on the rt side.  Mother- early heart disease at age 80 yo.(smoker) Younger sister- 31 yo. Blocked abdominal aorta. Smoker.  Pt see gyn. She had pap in December 2015 was norma. Mammo also normal.  Pt rash on both pretibial region. This has been going on for 6 months. Started on rt side first. Lt side started later. Pt thinks stress makes worse. She does not report any association to foods, detergents, creams, meds, outdoor expsure or any insect bites.   Pt saw dermatologist in past 2009. Told contact dermatitis. Given steroid cream in the past and eventually resolved.  Pt tried to get appointment with dermatologist. But appointment in April.      Review of Systems  Constitutional: Negative for fever, chills, diaphoresis, activity change and fatigue.  Respiratory: Negative for cough, chest tightness and shortness of breath.   Cardiovascular: Negative for chest pain, palpitations and leg swelling.  Gastrointestinal: Negative for nausea, vomiting and abdominal pain.  Musculoskeletal: Negative for neck pain and neck stiffness.       Cts in past.Lt side now more prominent than the rt side.  Neurological: Negative for dizziness, tremors, seizures, syncope, facial asymmetry, speech difficulty, weakness, light-headedness, numbness and headaches.  Psychiatric/Behavioral: Negative for behavioral problems, confusion and agitation. The patient is not nervous/anxious.    Past Medical History  Diagnosis Date  . Neuromuscular disorder   .  Kidney stone     History   Social History  . Marital Status: Married    Spouse Name: N/A  . Number of Children: N/A  . Years of Education: N/A   Occupational History  . Not on file.   Social History Main Topics  . Smoking status: Never Smoker   . Smokeless tobacco: Never Used  . Alcohol Use: 1.2 oz/week    2 Glasses of wine per week     Comment: 09/28/2013 "2, 6oz glasses of wine/day"  . Drug Use: No  . Sexual Activity: Yes   Other Topics Concern  . Not on file   Social History Narrative    Past Surgical History  Procedure Laterality Date  . Tubal ligation Bilateral 2007  . Endometrial ablation  2007  . Colonoscopy    . Parathyroidectomy  09/28/2013  . Tonsillectomy  ~ 1971  . Dilation and curettage of uterus  2007  . Parathyroidectomy N/A 09/28/2013    Procedure: PARATHYROIDECTOMY;  Surgeon: Joyice Faster. Cornett, MD;  Location: [facility];  Service: General;  Laterality: N/A;  . Carpal tunnel release      Right hand    Family History  Problem Relation Age of Onset  . Heart disease Mother     No Known Allergies  No current outpatient prescriptions on file prior to visit.   No current facility-administered medications on file prior to visit.    BP 152/79 mmHg  Pulse 97  Temp(Src) 98.1 F (36.7 C) (Oral)  Ht 5' 2.75" (1.594 m)  Wt 191 lb 9.6 oz (86.909 kg)  BMI 34.20 kg/m2  SpO2 96%      Objective:   Physical Exam  General Mental Status- Alert. General Appearance- Not in acute distress.   Skin Both pretibial area has hyperpigmented almost llichinified appearance. The rt side is greater than the left. The skin on rt pretibial area looks dry and on close inspection has flaky appearance. Rt side rash area cover about 12 cm x 6 cm region. Lt side coveres about 8 cm x 5 cm are. Niether side is tender. Both side feel faintly warm. But this faint warmth is symmetric. Also some follicles look faint inflammed on both legs.   Neck Carotid Arteries- Normal color.  Moisture- Normal Moisture. No carotid bruits. No JVD.  Chest and Lung Exam Auscultation: Breath Sounds:-Normal.  Cardiovascular Auscultation:Rythm- Regular. Murmurs & Other Heart Sounds:Auscultation of the heart reveals- No Murmurs.   Neurologic Cranial Nerve exam:- CN III-XII grossly I intact  HEENT Posterior pharynx normal. No swelling of buccal mucosa.       Assessment & Plan:

## 2014-05-08 NOTE — Assessment & Plan Note (Signed)
.  Pt has kidney stone. Related to hyperthyroidism. Stone last in January. Found CA level up. Pt had parthyroidism(overactive). She had removed. NO kidney stones since.

## 2015-08-14 IMAGING — US US SOFT TISSUE HEAD/NECK
1 series · 13 of 25 positions shown · non-contrast
Comparison: None.

CLINICAL DATA: Parathyroid adenoma

EXAM:
THYROID ULTRASOUND
TECHNIQUE: Ultrasound examination of the thyroid gland and adjacent soft
tissues was performed.

[Series 1: us soft tissue head/neck · 0.07mm/px · 13 of 42 slices shown]
[im 1/42]
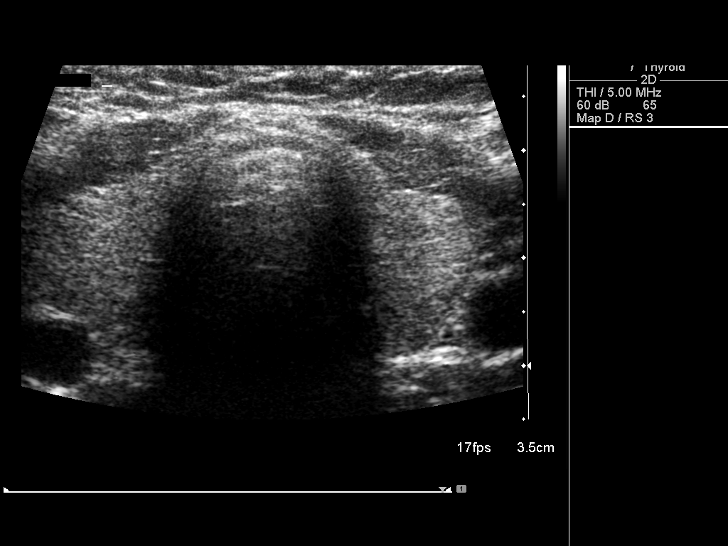
[im 4/42]
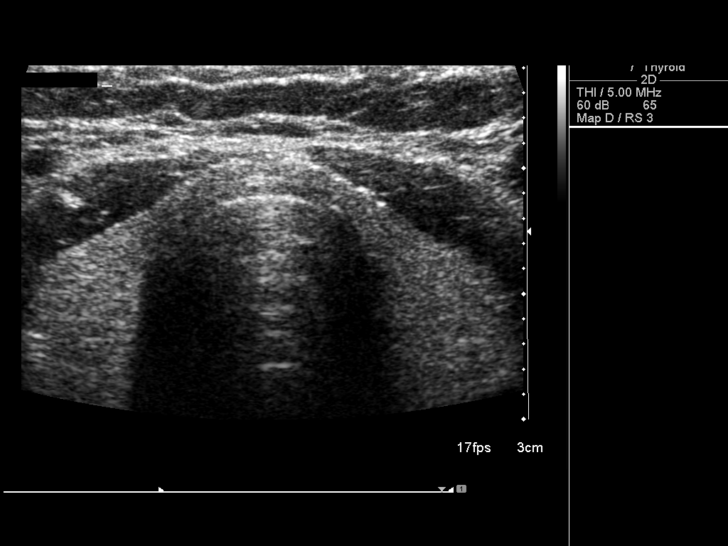
[im 7/42]
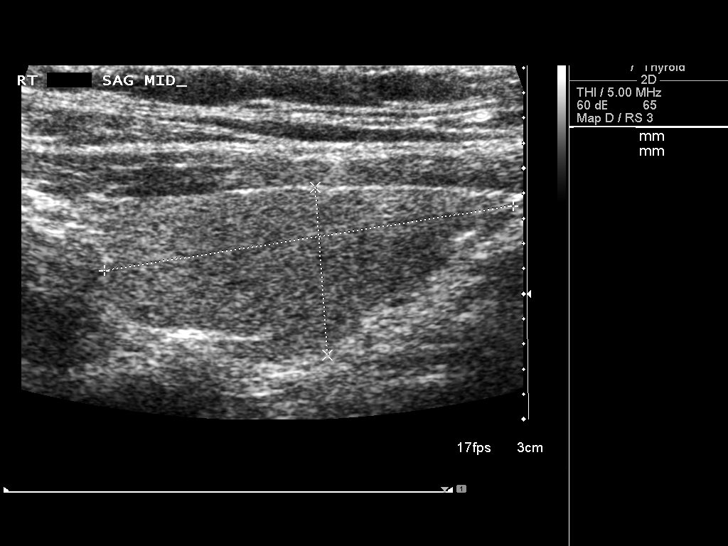
[im 11/42]
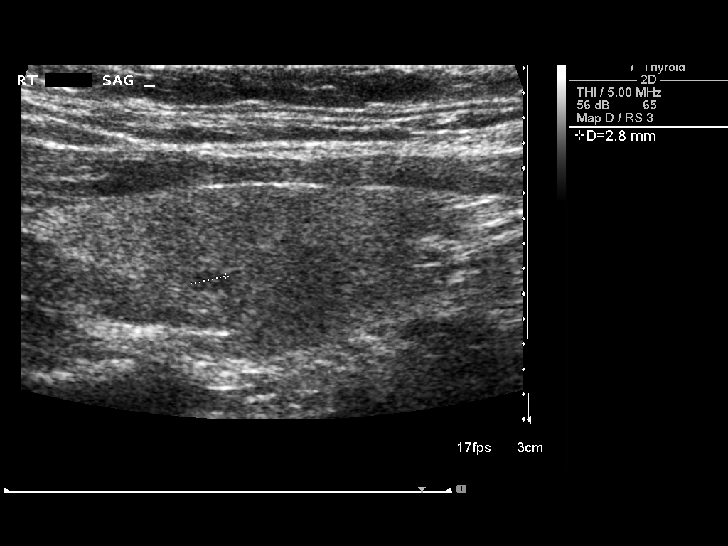
[im 14/42]
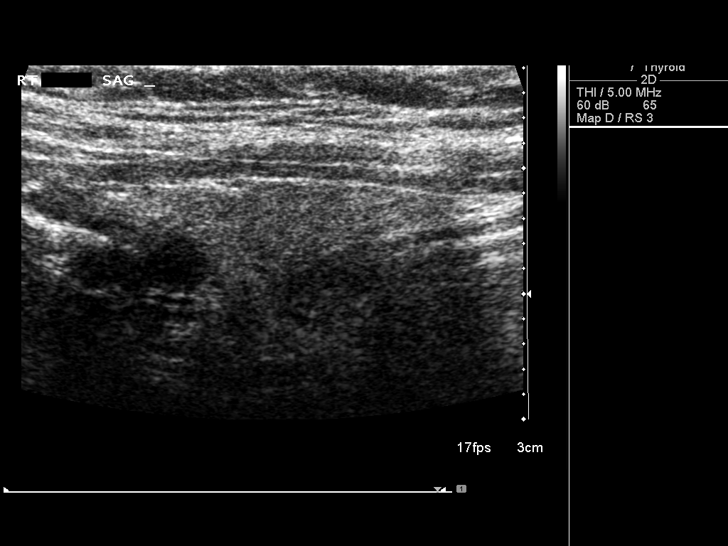
[im 18/42]
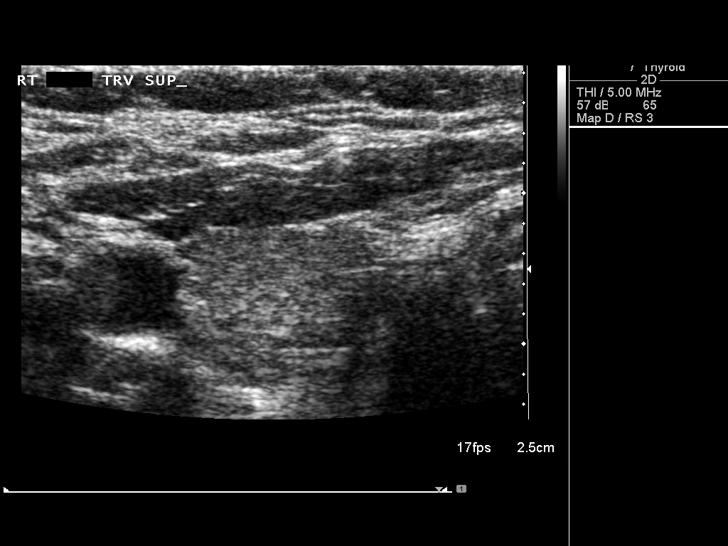
[im 21/42]
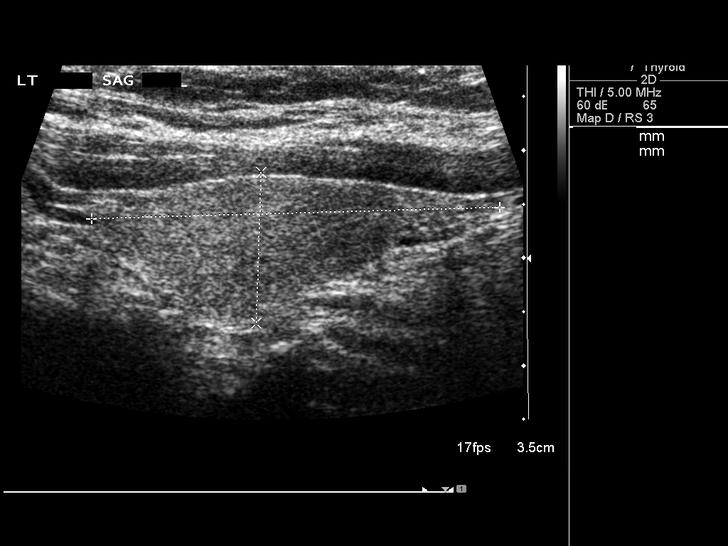
[im 24/42]
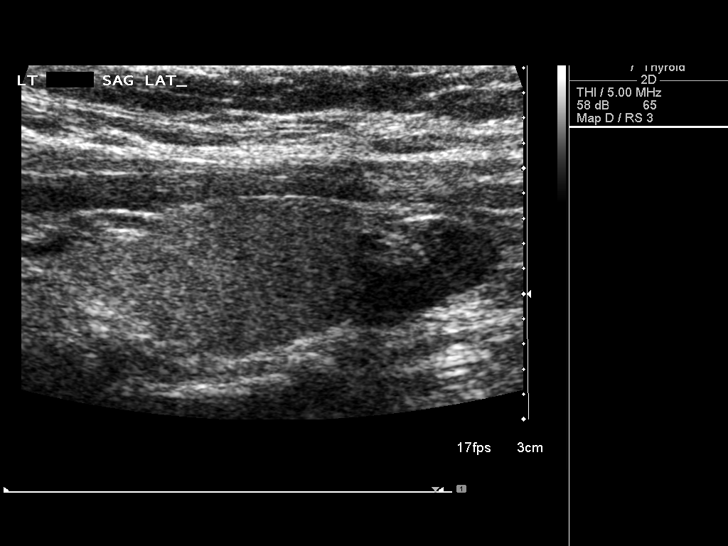
[im 28/42]
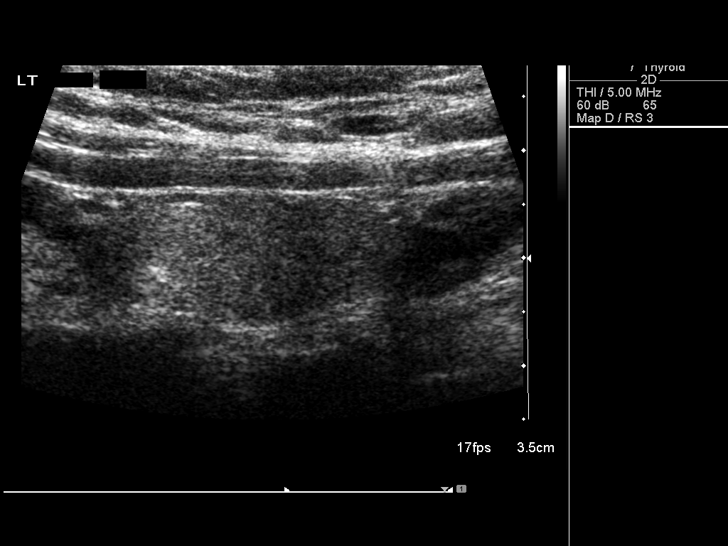
[im 31/42]
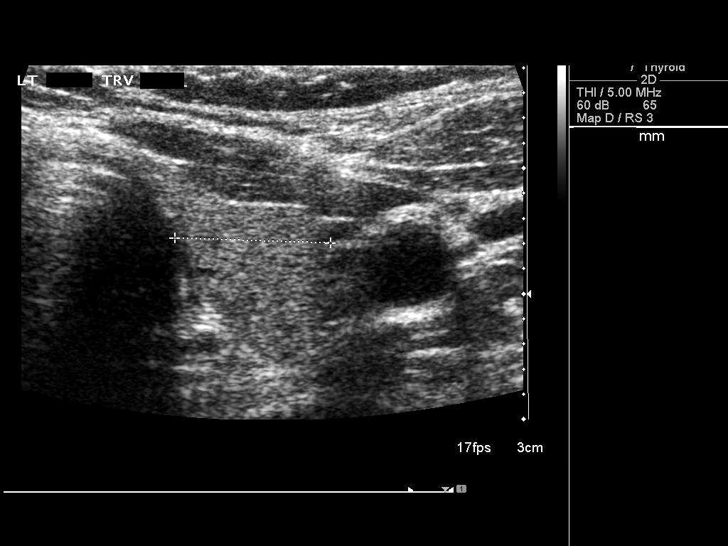
[im 35/42]
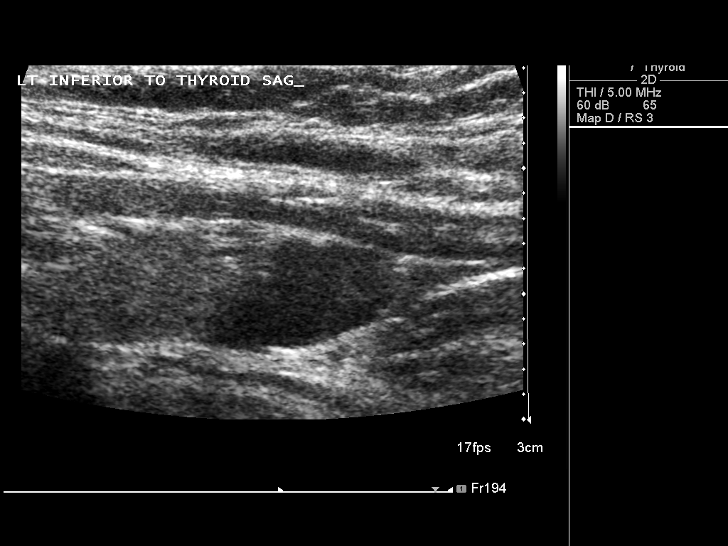
[im 38/42]
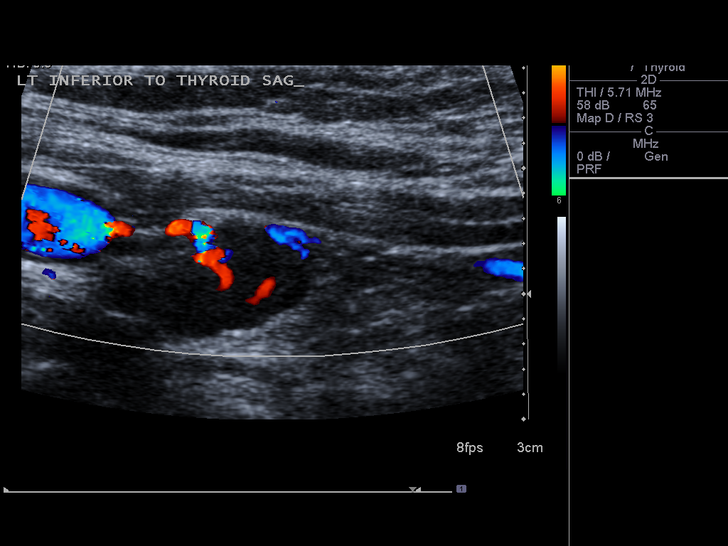
[im 42/42]
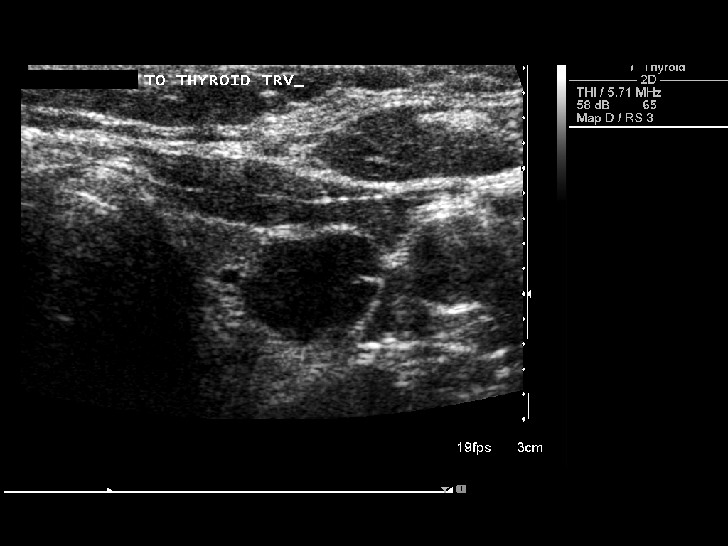

[13 of 25 positions shown; findings below may reference images not displayed]

FINDINGS: Right thyroid lobe

Measurements: 3.3 x 1.3 x 1.6 cm. Tiny 3 mm hypoechoic solid nodule
in the superior gland.

Left thyroid lobe

Measurements: 3.8 x 1.4 x 1.2 cm. Circumscribed, homogeneous 5 x 4 x
4 mm solid hypoechoic nodule in the superior aspect of the gland.
Macro lobulated approximately 1.7 x 0.9 x 1.1 cm hypoechoic solid
mass noted adjacent to the inferior tip of the left gland.

Isthmus

Thickness: 0.2 cm.  No nodules visualized.

Lymphadenopathy

None visualized.
IMPRESSION: 1. There is a 1.7 cm macro lobulated hypoechoic mass adjacent to the
inferior pole of the left thyroid gland. Differential considerations
include exophytic thyroid nodule and parathyroid nodule/adenoma.
Consider nuclear medicine sestamibi scan for correlation.
2. Tiny sub-cm hypoechoic solid nodules, 1 each in the upper aspect
of the right and left side of the gland. Findings do not meet
current SRU consensus criteria for biopsy. Follow-up by clinical
exam is recommended. If patient has known risk factors for thyroid
carcinoma, consider follow-up ultrasound in 12 months. If patient is
clinically hyperthyroid, consider nuclear medicine thyroid uptake
and scan. Reference: Management of Thyroid Nodules Detected at US:
Society of Radiologists in Ultrasound Consensus Conference

## 2015-09-24 ENCOUNTER — Ambulatory Visit: Payer: Managed Care, Other (non HMO) | Admitting: Medical

## 2015-09-24 ENCOUNTER — Telehealth: Payer: Self-pay | Admitting: Medical

## 2015-09-24 NOTE — Telephone Encounter (Signed)
Patient called at 9:45am cancelling 11:15am appointment for today. Charge or no charge

## 2015-09-24 NOTE — Telephone Encounter (Signed)
charge 

## 2015-10-02 ENCOUNTER — Encounter: Payer: Self-pay | Admitting: Medical

## 2015-11-15 ENCOUNTER — Ambulatory Visit (INDEPENDENT_AMBULATORY_CARE_PROVIDER_SITE_OTHER): Payer: Managed Care, Other (non HMO) | Admitting: Medical

## 2015-11-15 ENCOUNTER — Telehealth: Payer: Self-pay | Admitting: Medical

## 2015-11-15 ENCOUNTER — Encounter: Payer: Self-pay | Admitting: Medical

## 2015-11-15 VITALS — BP 112/80 | HR 96 | Temp 98.4°F | Ht 63.0 in | Wt 195.2 lb

## 2015-11-15 DIAGNOSIS — R319 Hematuria, unspecified: Secondary | ICD-10-CM

## 2015-11-15 DIAGNOSIS — Z0000 Encounter for general adult medical examination without abnormal findings: Secondary | ICD-10-CM

## 2015-11-15 DIAGNOSIS — Z1283 Encounter for screening for malignant neoplasm of skin: Secondary | ICD-10-CM | POA: Diagnosis not present

## 2015-11-15 LAB — COMPREHENSIVE METABOLIC PANEL
ALBUMIN: 4.3 g/dL (ref 3.5–5.2)
ALK PHOS: 58 U/L (ref 39–117)
ALT: 58 U/L — ABNORMAL HIGH (ref 0–35)
AST: 37 U/L (ref 0–37)
BUN: 12 mg/dL (ref 6–23)
CO2: 32 mEq/L (ref 19–32)
Calcium: 9.2 mg/dL (ref 8.4–10.5)
Chloride: 106 mEq/L (ref 96–112)
Creatinine, Ser: 0.6 mg/dL (ref 0.40–1.20)
GFR: 111.13 mL/min (ref >60.00)
GLUCOSE: 113 mg/dL — AB (ref 70–99)
Potassium: 4.3 mEq/L (ref 3.5–5.1)
Sodium: 143 mEq/L (ref 135–145)
TOTAL PROTEIN: 7.3 g/dL (ref 6.0–8.3)
Total Bilirubin: 0.5 mg/dL (ref 0.2–1.2)

## 2015-11-15 LAB — LIPID PANEL
Cholesterol: 204 mg/dL — ABNORMAL HIGH (ref 0–200)
HDL: 44.6 mg/dL (ref >39.00)
LDL Cholesterol: 126 mg/dL — ABNORMAL HIGH (ref 0–99)
NonHDL: 159.06
Total CHOL/HDL Ratio: 5
Triglycerides: 167 mg/dL — ABNORMAL HIGH (ref 0.0–149.0)
VLDL: 33.4 mg/dL (ref 0.0–40.0)

## 2015-11-15 LAB — POCT URINALYSIS DIPSTICK
Bilirubin, UA: NEGATIVE
Glucose, UA: NEGATIVE
Ketones, UA: NEGATIVE
Leukocytes, UA: NEGATIVE
Nitrite, UA: NEGATIVE
Protein, UA: NEGATIVE
Spec Grav, UA: >=1.03
Urobilinogen, UA: 0.2
pH, UA: 5

## 2015-11-15 LAB — CBC WITH DIFFERENTIAL/PLATELET
Basophils Absolute: 0 10*3/uL (ref 0.0–0.1)
Basophils Relative: 0.4 % (ref 0.0–3.0)
Eosinophils Absolute: 0.1 10*3/uL (ref 0.0–0.7)
Eosinophils Relative: 1.7 % (ref 0.0–5.0)
HCT: 41.8 % (ref 36.0–46.0)
Hemoglobin: 14.3 g/dL (ref 12.0–15.0)
Lymphocytes Relative: 33.7 % (ref 12.0–46.0)
Lymphs Abs: 2 10*3/uL (ref 0.7–4.0)
MCHC: 34.3 g/dL (ref 30.0–36.0)
MCV: 84 fl (ref 78.0–100.0)
Monocytes Absolute: 0.4 10*3/uL (ref 0.1–1.0)
Monocytes Relative: 6.3 % (ref 3.0–12.0)
NEUTROS ABS: 3.4 10*3/uL (ref 1.4–7.7)
NEUTROS PCT: 57.9 % (ref 43.0–77.0)
Platelets: 210 10*3/uL (ref 150.0–400.0)
RBC: 4.98 Mil/uL (ref 3.87–5.11)
RDW: 13.3 % (ref 11.5–15.5)
WBC: 5.9 10*3/uL (ref 4.0–10.5)

## 2015-11-15 LAB — TSH: TSH: 1.02 u[IU]/mL (ref 0.35–4.50)

## 2015-11-15 NOTE — Progress Notes (Signed)
Pre visit review using our clinic tool,if applicable. No additional management support is needed unless otherwise documented below in the visit note.  

## 2015-11-15 NOTE — Progress Notes (Signed)
Subjective:    Patient ID: [name], female    DOB: 1960-03-28, 55 y.o.   MRN: [id number]  HPI   I have reviewed pt PMH, PSH, FH, Social History and Surgical History  Pt states balanced diet, not exercising. No sodas. Black coffee 2-3 cups a day. Pt works [employer].   Pt thinks last cpe with gyn 1 yr ago.  Papsmear done 01-28-2015. Study was normal. Bone density- was normal in 2016. Mammogram was done 01-2015.  Pt states she will see them again on 01-2016.  Pt had coloscopy 2-3 years ago and normal.   Pt due for tetanus./tdap. Pt does not want flu vaccine.(counseled if does get flu like illness be seen asap 1-2 days within onset illness. Earlier the better.    No acute illness or symptoms today.  Pt is fasting.    Review of Systems  Constitutional: Negative for chills, fatigue and fever.  HENT: Negative for congestion, ear pain, postnasal drip, rhinorrhea, sinus pressure, sore throat and trouble swallowing.   Eyes: Negative for pain and redness.  Respiratory: Negative for cough, chest tightness, shortness of breath and wheezing.   Cardiovascular: Negative for chest pain and palpitations.  Gastrointestinal: Negative for abdominal pain, diarrhea, nausea and vomiting.       Hx of some epigstric burning rare and occasional. Not after eating.  Occurs 2 times a week and maybe last one hour.   Genitourinary: Negative for difficulty urinating and dyspareunia.  Musculoskeletal: Negative for back pain.  Skin: Negative for rash.  Neurological: Negative for dizziness, speech difficulty, weakness and headaches.  Hematological: Negative for adenopathy. Does not bruise/bleed easily.  Psychiatric/Behavioral: Negative for behavioral problems and confusion.    Past Medical History:  Diagnosis Date  . Kidney stone   . Neuromuscular disorder [facility])      Social History   Social History  . Marital status: Married    Spouse name: N/A  . Number of children:  N/A  . Years of education: N/A   Occupational History  . Not on file.   Social History Main Topics  . Smoking status: Never Smoker  . Smokeless tobacco: Never Used  . Alcohol use 1.2 oz/week    2 Glasses of wine per week     Comment: 09/28/2013 "2, 6oz glasses of wine/day"  . Drug use: No  . Sexual activity: Yes   Other Topics Concern  . Not on file   Social History Narrative  . No narrative on file    Past Surgical History:  Procedure Laterality Date  . CARPAL TUNNEL RELEASE     Right hand  . COLONOSCOPY    . DILATION AND CURETTAGE OF UTERUS  2007  . ENDOMETRIAL ABLATION  2007  . PARATHYROIDECTOMY  09/28/2013  . PARATHYROIDECTOMY N/A 09/28/2013   Procedure: PARATHYROIDECTOMY;  Surgeon: Joyice Faster. Cornett, MD;  Location: [place];  Service: General;  Laterality: N/A;  . TONSILLECTOMY  ~ 1971  . TUBAL LIGATION Bilateral 2007    Family History  Problem Relation Age of Onset  . Heart disease Mother     No Known Allergies  Current Outpatient Prescriptions on File Prior to Visit  Medication Sig Dispense Refill  . ammonium lactate (LAC-HYDRIN) 12 % cream Apply to skin bid (Patient not taking: Reported on 11/15/2015) 385 g 0  . doxycycline (VIBRA-TABS) 100 MG tablet Take 1 tablet (100 mg total) by mouth 2 (two) times daily. (Patient not taking: Reported on 11/15/2015) 14 tablet 0  .  hydrOXYzine (ATARAX/VISTARIL) 25 MG tablet Take 1 tablet (25 mg total) by mouth 3 (three) times daily as needed. (Patient not taking: Reported on 11/15/2015) 30 tablet 0  . predniSONE (DELTASONE) 20 MG tablet 1 tab po tid day 1 and day 2, then 1 tab po bid day 3 and day 4, then 1 tab po q day 5 and day 6. (Patient not taking: Reported on 11/15/2015) 12 tablet 0   No current facility-administered medications on file prior to visit.     BP 112/80   Pulse 96   Temp 98.4 F (36.9 C) (Oral)   Ht 5\' 3"  (1.6 m)   Wt 195 lb 3.2 oz (88.5 kg)   SpO2 98%   BMI 34.58 kg/m       Objective:   Physical  Exam  General Mental Status- Alert. General Appearance- Not in acute distress.   Skin General: Color- Normal Color. Moisture- Normal Moisture. Scattered small moles over her back. Some borderline in size.  Neck Carotid Arteries- Normal color. Moisture- Normal Moisture. No carotid bruits. No JVD.  Chest and Lung Exam Auscultation: Breath Sounds:-Normal.  Cardiovascular Auscultation:Rythm- Regular, Rate and rhythm. Murmurs & Other Heart Sounds:Auscultation of the heart reveals- No Murmurs.  Abdomen Inspection:-Inspeection Normal. Palpation/Percussion:Note:No mass. Palpation and Percussion of the abdomen reveal- Non Tender, Non Distended + BS, no rebound or guarding.   Neurologic Cranial Nerve exam:- CN III-XII intact(No nystagmus), symmetric smile. Strength:- 5/5 equal and symmetric strength both upper and lower extremities.       Assessment & Plan:  For your wellness exam we got fasting labs including hiv, cmp, cbc, tsh, and lipid panel .UA dip as well in lab.  Will refer you dermatologist for skin surveillance.  For possible mild reflux advise can try tums or zantac and assess response. Eat healthy diet. If pain more constant then notify us and could get h pylori study as well as other labs.  Please have gyn send [place] pap, mammo and bone density results.  If you change you mind of tdap or flu vaccine let us know.  Follow up date to be determined after lab review.or as needed    Mackie Pai, [employer]

## 2015-11-15 NOTE — Patient Instructions (Addendum)
For your wellness exam we got fasting labs including hiv, cmp, cbc, tsh, and lipid panel .UA dip as well in lab.  Will refer you dermatologist for skin surveillance.(If specialist office does not call you then can call here and speak with [name] for update)  For possible mild reflux advise can try tums or zantac and assess response. Eat healthy diet. If pain more constant then notify [name] and could get h pylori study as well as other labs.  Please have gyn send [name] pap, mammo and bone density results.  If you change you mind of tdap or flu vaccine let [name] know.  Follow up date to be determined after lab review.or as needed  We made a copy of your form and will fill out when labs are back.  Preventive Care for Adults, Female A healthy lifestyle and preventive care can promote health and wellness. Preventive health guidelines for women include the following key practices.  A routine yearly physical is a good way to check with your health care provider about your health and preventive screening. It is a chance to share any concerns and updates on your health and to receive a thorough exam.  Visit your dentist for a routine exam and preventive care every 6 months. Brush your teeth twice a day and floss once a day. Good oral hygiene prevents tooth decay and gum disease.  The frequency of eye exams is based on your age, health, family medical history, use of contact lenses, and other factors. Follow your health care provider's recommendations for frequency of eye exams.  Eat a healthy diet. Foods like vegetables, fruits, whole grains, low-fat dairy products, and lean protein foods contain the nutrients you need without too many calories. Decrease your intake of foods high in solid fats, added sugars, and salt. Eat the right amount of calories for you.Get information about a proper diet from your health care provider, if necessary.  Regular physical exercise is one of the most important things you  can do for your health. Most adults should get at least 150 minutes of moderate-intensity exercise (any activity that increases your heart rate and causes you to sweat) each week. In addition, most adults need muscle-strengthening exercises on 2 or more days a week.  Maintain a healthy weight. The body mass index (BMI) is a screening tool to identify possible weight problems. It provides an estimate of body fat based on height and weight. Your health care provider can find your BMI and can help you achieve or maintain a healthy weight.For adults 20 years and older:  A BMI below 18.5 is considered underweight.  A BMI of 18.5 to 24.9 is normal.  A BMI of 25 to 29.9 is considered overweight.  A BMI of 30 and above is considered obese.  Maintain normal blood lipids and cholesterol levels by exercising and minimizing your intake of saturated fat. Eat a balanced diet with plenty of fruit and vegetables. Blood tests for lipids and cholesterol should begin at age 66 and be repeated every 5 years. If your lipid or cholesterol levels are high, you are over 50, or you are at high risk for heart disease, you may need your cholesterol levels checked more frequently.Ongoing high lipid and cholesterol levels should be treated with medicines if diet and exercise are not working.  If you smoke, find out from your health care provider how to quit. If you do not use tobacco, do not start.  Lung cancer screening is recommended for adults  aged 69-80 years who are at high risk for developing lung cancer because of a history of smoking. A yearly low-dose CT scan of the lungs is recommended for people who have at least a 30-pack-year history of smoking and are a current smoker or have quit within the past 15 years. A pack year of smoking is smoking an average of 1 pack of cigarettes a day for 1 year (for example: 1 pack a day for 30 years or 2 packs a day for 15 years). Yearly screening should continue until the smoker  has stopped smoking for at least 15 years. Yearly screening should be stopped for people who develop a health problem that would prevent them from having lung cancer treatment.  If you are pregnant, do not drink alcohol. If you are breastfeeding, be very cautious about drinking alcohol. If you are not pregnant and choose to drink alcohol, do not have more than 1 drink per day. One drink is considered to be 12 ounces (355 mL) of beer, 5 ounces (148 mL) of wine, or 1.5 ounces (44 mL) of liquor.  Avoid use of street drugs. Do not share needles with anyone. Ask for help if you need support or instructions about stopping the use of drugs.  High blood pressure causes heart disease and increases the risk of stroke. Your blood pressure should be checked at least every 1 to 2 years. Ongoing high blood pressure should be treated with medicines if weight loss and exercise do not work.  If you are 30-28 years old, ask your health care provider if you should take aspirin to prevent strokes.  Diabetes screening is done by taking a blood sample to check your blood glucose level after you have not eaten for a certain period of time (fasting). If you are not overweight and you do not have risk factors for diabetes, you should be screened once every 3 years starting at age 60. If you are overweight or obese and you are 43-74 years of age, you should be screened for diabetes every year as part of your cardiovascular risk assessment.  Breast cancer screening is essential preventive care for women. You should practice "breast self-awareness." This means understanding the normal appearance and feel of your breasts and may include breast self-examination. Any changes detected, no matter how small, should be reported to a health care provider. Women in their 71s and 30s should have a clinical breast exam (CBE) by a health care provider as part of a regular health exam every 1 to 3 years. After age 44, women should have a CBE  every year. Starting at age 58, women should consider having a mammogram (breast X-ray test) every year. Women who have a family history of breast cancer should talk to their health care provider about genetic screening. Women at a high risk of breast cancer should talk to their health care providers about having an MRI and a mammogram every year.  Breast cancer gene (BRCA)-related cancer risk assessment is recommended for women who have family members with BRCA-related cancers. BRCA-related cancers include breast, ovarian, tubal, and peritoneal cancers. Having family members with these cancers may be associated with an increased risk for harmful changes (mutations) in the breast cancer genes BRCA1 and BRCA2. Results of the assessment will determine the need for genetic counseling and BRCA1 and BRCA2 testing.  Your health care provider may recommend that you be screened regularly for cancer of the pelvic organs (ovaries, uterus, and vagina). This screening involves a  pelvic examination, including checking for microscopic changes to the surface of your cervix (Pap test). You may be encouraged to have this screening done every 3 years, beginning at age 21.  For women ages 59-65, health care providers may recommend pelvic exams and Pap testing every 3 years, or they may recommend the Pap and pelvic exam, combined with testing for human papilloma virus (HPV), every 5 years. Some types of HPV increase your risk of cervical cancer. Testing for HPV may also be done on women of any age with unclear Pap test results.  Other health care providers may not recommend any screening for nonpregnant women who are considered low risk for pelvic cancer and who do not have symptoms. Ask your health care provider if a screening pelvic exam is right for you.  If you have had past treatment for cervical cancer or a condition that could lead to cancer, you need Pap tests and screening for cancer for at least 20 years after your  treatment. If Pap tests have been discontinued, your risk factors (such as having a new sexual partner) need to be reassessed to determine if screening should resume. Some women have medical problems that increase the chance of getting cervical cancer. In these cases, your health care provider may recommend more frequent screening and Pap tests.  Colorectal cancer can be detected and often prevented. Most routine colorectal cancer screening begins at the age of 52 years and continues through age 64 years. However, your health care provider may recommend screening at an earlier age if you have risk factors for colon cancer. On a yearly basis, your health care provider may provide home test kits to check for hidden blood in the stool. Use of a small camera at the end of a tube, to directly examine the colon (sigmoidoscopy or colonoscopy), can detect the earliest forms of colorectal cancer. Talk to your health care provider about this at age 13, when routine screening begins. Direct exam of the colon should be repeated every 5-10 years through age 53 years, unless early forms of precancerous polyps or small growths are found.  People who are at an increased risk for hepatitis B should be screened for this virus. You are considered at high risk for hepatitis B if:  You were born in a country where hepatitis B occurs often. Talk with your health care provider about which countries are considered high risk.  Your parents were born in a high-risk country and you have not received a shot to protect against hepatitis B (hepatitis B vaccine).  You have HIV or AIDS.  You use needles to inject street drugs.  You live with, or have sex with, someone who has hepatitis B.  You get hemodialysis treatment.  You take certain medicines for conditions like cancer, organ transplantation, and autoimmune conditions.  Hepatitis C blood testing is recommended for all people born from [birth year] through 1965 and any individual  with known risks for hepatitis C.  Practice safe sex. Use condoms and avoid high-risk sexual practices to reduce the spread of sexually transmitted infections (STIs). STIs include gonorrhea, chlamydia, syphilis, trichomonas, herpes, HPV, and human immunodeficiency virus (HIV). Herpes, HIV, and HPV are viral illnesses that have no cure. They can result in disability, cancer, and death.  You should be screened for sexually transmitted illnesses (STIs) including gonorrhea and chlamydia if:  You are sexually active and are younger than 24 years.  You are older than 24 years and your health care provider tells  you that you are at risk for this type of infection.  Your sexual activity has changed since you were last screened and you are at an increased risk for chlamydia or gonorrhea. Ask your health care provider if you are at risk.  If you are at risk of being infected with HIV, it is recommended that you take a prescription medicine daily to prevent HIV infection. This is called preexposure prophylaxis (PrEP). You are considered at risk if:  You are sexually active and do not regularly use condoms or know the HIV status of your partner(s).  You take drugs by injection.  You are sexually active with a partner who has HIV.  Talk with your health care provider about whether you are at high risk of being infected with HIV. If you choose to begin PrEP, you should first be tested for HIV. You should then be tested every 3 months for as long as you are taking PrEP.  Osteoporosis is a disease in which the bones lose minerals and strength with aging. This can result in serious bone fractures or breaks. The risk of osteoporosis can be identified using a bone density scan. Women ages 29 years and over and women at risk for fractures or osteoporosis should discuss screening with their health care providers. Ask your health care provider whether you should take a calcium supplement or vitamin D to reduce the  rate of osteoporosis.  Menopause can be associated with physical symptoms and risks. Hormone replacement therapy is available to decrease symptoms and risks. You should talk to your health care provider about whether hormone replacement therapy is right for you.  Use sunscreen. Apply sunscreen liberally and repeatedly throughout the day. You should seek shade when your shadow is shorter than you. Protect yourself by wearing long sleeves, pants, a wide-brimmed hat, and sunglasses year round, whenever you are outdoors.  Once a month, do a whole body skin exam, using a mirror to look at the skin on your back. Tell your health care provider of new moles, moles that have irregular borders, moles that are larger than a pencil eraser, or moles that have changed in shape or color.  Stay current with required vaccines (immunizations).  Influenza vaccine. All adults should be immunized every year.  Tetanus, diphtheria, and acellular pertussis (Td, Tdap) vaccine. Pregnant women should receive 1 dose of Tdap vaccine during each pregnancy. The dose should be obtained regardless of the length of time since the last dose. Immunization is preferred during the 27th-36th week of gestation. An adult who has not previously received Tdap or who does not know her vaccine status should receive 1 dose of Tdap. This initial dose should be followed by tetanus and diphtheria toxoids (Td) booster doses every 10 years. Adults with an unknown or incomplete history of completing a 3-dose immunization series with Td-containing vaccines should begin or complete a primary immunization series including a Tdap dose. Adults should receive a Td booster every 10 years.  Varicella vaccine. An adult without evidence of immunity to varicella should receive 2 doses or a second dose if she has previously received 1 dose. Pregnant females who do not have evidence of immunity should receive the first dose after pregnancy. This first dose should be  obtained before leaving the health care facility. The second dose should be obtained 4-8 weeks after the first dose.  Human papillomavirus (HPV) vaccine. Females aged 13-26 years who have not received the vaccine previously should obtain the 3-dose series. The vaccine  is not recommended for use in pregnant females. However, pregnancy testing is not needed before receiving a dose. If a female is found to be pregnant after receiving a dose, no treatment is needed. In that case, the remaining doses should be delayed until after the pregnancy. Immunization is recommended for any person with an immunocompromised condition through the age of 14 years if she did not get any or all doses earlier. During the 3-dose series, the second dose should be obtained 4-8 weeks after the first dose. The third dose should be obtained 24 weeks after the first dose and 16 weeks after the second dose.  Zoster vaccine. One dose is recommended for adults aged 64 years or older unless certain conditions are present.  Measles, mumps, and rubella (MMR) vaccine. Adults born before [birth year] generally are considered immune to measles and mumps. Adults born in [birth year] or later should have 1 or more doses of MMR vaccine unless there is a contraindication to the vaccine or there is laboratory evidence of immunity to each of the three diseases. A routine second dose of MMR vaccine should be obtained at least 28 days after the first dose for students attending postsecondary schools, health care workers, or international travelers. People who received inactivated measles vaccine or an unknown type of measles vaccine during 1963-1967 should receive 2 doses of MMR vaccine. People who received inactivated mumps vaccine or an unknown type of mumps vaccine before 1979 and are at high risk for mumps infection should consider immunization with 2 doses of MMR vaccine. For females of childbearing age, rubella immunity should be determined. If there is no evidence  of immunity, females who are not pregnant should be vaccinated. If there is no evidence of immunity, females who are pregnant should delay immunization until after pregnancy. Unvaccinated health care workers born before [birth year] who lack laboratory evidence of measles, mumps, or rubella immunity or laboratory confirmation of disease should consider measles and mumps immunization with 2 doses of MMR vaccine or rubella immunization with 1 dose of MMR vaccine.  Pneumococcal 13-valent conjugate (PCV13) vaccine. When indicated, a person who is uncertain of his immunization history and has no record of immunization should receive the PCV13 vaccine. All adults 28 years of age and older should receive this vaccine. An adult aged 71 years or older who has certain medical conditions and has not been previously immunized should receive 1 dose of PCV13 vaccine. This PCV13 should be followed with a dose of pneumococcal polysaccharide (PPSV23) vaccine. Adults who are at high risk for pneumococcal disease should obtain the PPSV23 vaccine at least 8 weeks after the dose of PCV13 vaccine. Adults older than 53 years of age who have normal immune system function should obtain the PPSV23 vaccine dose at least 1 year after the dose of PCV13 vaccine.  Pneumococcal polysaccharide (PPSV23) vaccine. When PCV13 is also indicated, PCV13 should be obtained first. All adults aged 58 years and older should be immunized. An adult younger than age 68 years who has certain medical conditions should be immunized. Any person who resides in a nursing home or long-term care facility should be immunized. An adult smoker should be immunized. People with an immunocompromised condition and certain other conditions should receive both PCV13 and PPSV23 vaccines. People with human immunodeficiency virus (HIV) infection should be immunized as soon as possible after diagnosis. Immunization during chemotherapy or radiation therapy should be avoided. Routine use  of PPSV23 vaccine is not recommended for American Indians, [place] Natives, or people  younger than 26 years unless there are medical conditions that require PPSV23 vaccine. When indicated, people who have unknown immunization and have no record of immunization should receive PPSV23 vaccine. One-time revaccination 5 years after the first dose of PPSV23 is recommended for people aged 19-64 years who have chronic kidney failure, nephrotic syndrome, asplenia, or immunocompromised conditions. People who received 1-2 doses of PPSV23 before age 86 years should receive another dose of PPSV23 vaccine at age 57 years or later if at least 5 years have passed since the previous dose. Doses of PPSV23 are not needed for people immunized with PPSV23 at or after age 51 years.  Meningococcal vaccine. Adults with asplenia or persistent complement component deficiencies should receive 2 doses of quadrivalent meningococcal conjugate (MenACWY-D) vaccine. The doses should be obtained at least 2 months apart. Microbiologists working with certain meningococcal bacteria, [employer] recruits, people at risk during an outbreak, and people who travel to or live in countries with a high rate of meningitis should be immunized. A first-year college student up through age 50 years who is living in a residence hall should receive a dose if she did not receive a dose on or after her 16th birthday. Adults who have certain high-risk conditions should receive one or more doses of vaccine.  Hepatitis A vaccine. Adults who wish to be protected from this disease, have certain high-risk conditions, work with hepatitis A-infected animals, work in hepatitis A research labs, or travel to or work in countries with a high rate of hepatitis A should be immunized. Adults who were previously unvaccinated and who anticipate close contact with an international adoptee during the first 60 days after arrival in the [place] from a country with a high rate of  hepatitis A should be immunized.  Hepatitis B vaccine. Adults who wish to be protected from this disease, have certain high-risk conditions, may be exposed to blood or other infectious body fluids, are household contacts or sex partners of hepatitis B positive people, are clients or workers in certain care facilities, or travel to or work in countries with a high rate of hepatitis B should be immunized.  Haemophilus influenzae type b (Hib) vaccine. A previously unvaccinated person with asplenia or sickle cell disease or having a scheduled splenectomy should receive 1 dose of Hib vaccine. Regardless of previous immunization, a recipient of a hematopoietic stem cell transplant should receive a 3-dose series 6-12 months after her successful transplant. Hib vaccine is not recommended for adults with HIV infection. Preventive Services / Frequency Ages 24 to 72 years  Blood pressure check.** / Every 3-5 years.  Lipid and cholesterol check.** / Every 5 years beginning at age 44.  Clinical breast exam.** / Every 3 years for women in their 57s and 61s.  BRCA-related cancer risk assessment.** / For women who have family members with a BRCA-related cancer (breast, ovarian, tubal, or peritoneal cancers).  Pap test.** / Every 2 years from ages 44 through 62. Every 3 years starting at age 11 through age 100 or 12 with a history of 3 consecutive normal Pap tests.  HPV screening.** / Every 3 years from ages 50 through ages 49 to 76 with a history of 3 consecutive normal Pap tests.  Hepatitis C blood test.** / For any individual with known risks for hepatitis C.  Skin self-exam. / Monthly.  Influenza vaccine. / Every year.  Tetanus, diphtheria, and acellular pertussis (Tdap, Td) vaccine.** / Consult your health care provider. Pregnant women should receive 1 dose  of Tdap vaccine during each pregnancy. 1 dose of Td every 10 years.  Varicella vaccine.** / Consult your health care provider. Pregnant females  who do not have evidence of immunity should receive the first dose after pregnancy.  HPV vaccine. / 3 doses over 6 months, if 31 and younger. The vaccine is not recommended for use in pregnant females. However, pregnancy testing is not needed before receiving a dose.  Measles, mumps, rubella (MMR) vaccine.** / You need at least 1 dose of MMR if you were born in 1957 or later. You may also need a 2nd dose. For females of childbearing age, rubella immunity should be determined. If there is no evidence of immunity, females who are not pregnant should be vaccinated. If there is no evidence of immunity, females who are pregnant should delay immunization until after pregnancy.  Pneumococcal 13-valent conjugate (PCV13) vaccine.** / Consult your health care provider.  Pneumococcal polysaccharide (PPSV23) vaccine.** / 1 to 2 doses if you smoke cigarettes or if you have certain conditions.  Meningococcal vaccine.** / 1 dose if you are age 47 to 18 years and a Market researcher living in a residence hall, or have one of several medical conditions, you need to get vaccinated against meningococcal disease. You may also need additional booster doses.  Hepatitis A vaccine.** / Consult your health care provider.  Hepatitis B vaccine.** / Consult your health care provider.  Haemophilus influenzae type b (Hib) vaccine.** / Consult your health care provider. Ages 41 to 2 years  Blood pressure check.** / Every year.  Lipid and cholesterol check.** / Every 5 years beginning at age 7 years.  Lung cancer screening. / Every year if you are aged 76-80 years and have a 30-pack-year history of smoking and currently smoke or have quit within the past 15 years. Yearly screening is stopped once you have quit smoking for at least 15 years or develop a health problem that would prevent you from having lung cancer treatment.  Clinical breast exam.** / Every year after age 85 years.  BRCA-related cancer risk  assessment.** / For women who have family members with a BRCA-related cancer (breast, ovarian, tubal, or peritoneal cancers).  Mammogram.** / Every year beginning at age 13 years and continuing for as long as you are in good health. Consult with your health care provider.  Pap test.** / Every 3 years starting at age 32 years through age 47 or 8 years with a history of 3 consecutive normal Pap tests.  HPV screening.** / Every 3 years from ages 29 years through ages 8 to 23 years with a history of 3 consecutive normal Pap tests.  Fecal occult blood test (FOBT) of stool. / Every year beginning at age 48 years and continuing until age 47 years. You may not need to do this test if you get a colonoscopy every 10 years.  Flexible sigmoidoscopy or colonoscopy.** / Every 5 years for a flexible sigmoidoscopy or every 10 years for a colonoscopy beginning at age 69 years and continuing until age 72 years.  Hepatitis C blood test.** / For all people born from [birth year] through 1963 and any individual with known risks for hepatitis C.  Skin self-exam. / Monthly.  Influenza vaccine. / Every year.  Tetanus, diphtheria, and acellular pertussis (Tdap/Td) vaccine.** / Consult your health care provider. Pregnant women should receive 1 dose of Tdap vaccine during each pregnancy. 1 dose of Td every 10 years.  Varicella vaccine.** / Consult your health care provider. Pregnant  females who do not have evidence of immunity should receive the first dose after pregnancy.  Zoster vaccine.** / 1 dose for adults aged 58 years or older.  Measles, mumps, rubella (MMR) vaccine.** / You need at least 1 dose of MMR if you were born in 1957 or later. You may also need a second dose. For females of childbearing age, rubella immunity should be determined. If there is no evidence of immunity, females who are not pregnant should be vaccinated. If there is no evidence of immunity, females who are pregnant should delay immunization until  after pregnancy.  Pneumococcal 13-valent conjugate (PCV13) vaccine.** / Consult your health care provider.  Pneumococcal polysaccharide (PPSV23) vaccine.** / 1 to 2 doses if you smoke cigarettes or if you have certain conditions.  Meningococcal vaccine.** / Consult your health care provider.  Hepatitis A vaccine.** / Consult your health care provider.  Hepatitis B vaccine.** / Consult your health care provider.  Haemophilus influenzae type b (Hib) vaccine.** / Consult your health care provider. Ages 27 years and over  Blood pressure check.** / Every year.  Lipid and cholesterol check.** / Every 5 years beginning at age 54 years.  Lung cancer screening. / Every year if you are aged 13-80 years and have a 30-pack-year history of smoking and currently smoke or have quit within the past 15 years. Yearly screening is stopped once you have quit smoking for at least 15 years or develop a health problem that would prevent you from having lung cancer treatment.  Clinical breast exam.** / Every year after age 81 years.  BRCA-related cancer risk assessment.** / For women who have family members with a BRCA-related cancer (breast, ovarian, tubal, or peritoneal cancers).  Mammogram.** / Every year beginning at age 13 years and continuing for as long as you are in good health. Consult with your health care provider.  Pap test.** / Every 3 years starting at age 55 years through age 19 or 75 years with 3 consecutive normal Pap tests. Testing can be stopped between 65 and 70 years with 3 consecutive normal Pap tests and no abnormal Pap or HPV tests in the past 10 years.  HPV screening.** / Every 3 years from ages 15 years through ages 36 or 85 years with a history of 3 consecutive normal Pap tests. Testing can be stopped between 65 and 70 years with 3 consecutive normal Pap tests and no abnormal Pap or HPV tests in the past 10 years.  Fecal occult blood test (FOBT) of stool. / Every year beginning at  age 21 years and continuing until age 73 years. You may not need to do this test if you get a colonoscopy every 10 years.  Flexible sigmoidoscopy or colonoscopy.** / Every 5 years for a flexible sigmoidoscopy or every 10 years for a colonoscopy beginning at age 42 years and continuing until age 64 years.  Hepatitis C blood test.** / For all people born from [birth year] through 1963 and any individual with known risks for hepatitis C.  Osteoporosis screening.** / A one-time screening for women ages 52 years and over and women at risk for fractures or osteoporosis.  Skin self-exam. / Monthly.  Influenza vaccine. / Every year.  Tetanus, diphtheria, and acellular pertussis (Tdap/Td) vaccine.** / 1 dose of Td every 10 years.  Varicella vaccine.** / Consult your health care provider.  Zoster vaccine.** / 1 dose for adults aged 103 years or older.  Pneumococcal 13-valent conjugate (PCV13) vaccine.** / Consult your health care provider.  Pneumococcal polysaccharide (PPSV23) vaccine.** / 1 dose for all adults aged 30 years and older.  Meningococcal vaccine.** / Consult your health care provider.  Hepatitis A vaccine.** / Consult your health care provider.  Hepatitis B vaccine.** / Consult your health care provider.  Haemophilus influenzae type b (Hib) vaccine.** / Consult your health care provider. ** Family history and personal history of risk and conditions may change your health care provider's recommendations.   This information is not intended to replace advice given to you by your health care provider. Make sure you discuss any questions you have with your health care provider.   Document Released: 03/31/2001 Document Revised: 02/23/2014 Document Reviewed: 06/30/2010 Elsevier Interactive Patient Education [employer].

## 2015-11-15 NOTE — Telephone Encounter (Signed)
Not sure why it was opened. I saw pt and closed not. Opened in error?

## 2015-11-16 ENCOUNTER — Telehealth: Payer: Self-pay | Admitting: Medical

## 2015-11-16 DIAGNOSIS — R739 Hyperglycemia, unspecified: Secondary | ICD-10-CM

## 2015-11-16 LAB — HIV ANTIBODY (ROUTINE TESTING W REFLEX): HIV: NONREACTIVE

## 2015-11-16 NOTE — Telephone Encounter (Signed)
Future a1c placed.

## 2015-11-18 ENCOUNTER — Telehealth: Payer: Self-pay

## 2015-11-18 ENCOUNTER — Telehealth: Payer: Self-pay | Admitting: Medical

## 2015-11-18 ENCOUNTER — Other Ambulatory Visit: Payer: Self-pay

## 2015-11-18 ENCOUNTER — Other Ambulatory Visit: Payer: Self-pay | Admitting: Emergency Medicine

## 2015-11-18 DIAGNOSIS — R7309 Other abnormal glucose: Secondary | ICD-10-CM

## 2015-11-18 DIAGNOSIS — R319 Hematuria, unspecified: Secondary | ICD-10-CM

## 2015-11-18 LAB — URINE CULTURE

## 2015-11-18 MED ORDER — ATORVASTATIN CALCIUM 10 MG PO TABS: 10.0000 mg | ORAL_TABLET | Freq: Every day | ORAL | 3 refills | Status: AC

## 2015-11-18 MED ORDER — CIPROFLOXACIN HCL 500 MG PO TABS: 500.0000 mg | ORAL_TABLET | Freq: Two times a day (BID) | ORAL | 0 refills | Status: DC

## 2015-11-18 MED FILL — ATORVASTATIN 10 MG TABLET: 10 | 30 days supply | Qty: 30 | Fill #0

## 2015-11-18 MED FILL — CIPROFLOXACIN HCL 500 MG TA: 500 | 7 days supply | Qty: 14 | Fill #0

## 2015-11-18 NOTE — Telephone Encounter (Signed)
Patient notified

## 2015-11-18 NOTE — Telephone Encounter (Signed)
Rx low dos atrovastatin sent to her pharmacy.

## 2015-11-18 NOTE — Telephone Encounter (Signed)
rx of cipro sent to her pharmacy.

## 2015-11-19 NOTE — Telephone Encounter (Signed)
Pt called in because she says that she received a prescription for CIPRO. She says that the pharmacy stated that they are unable to fill until 3 days. She would like          CB: [phone]

## 2015-11-19 NOTE — Telephone Encounter (Signed)
Patient is calling back regarding this issue. She is worried that her infection may get worse. She said the pharmacy is thinking that her insurance is treating the prescription as a refill. Please advise.

## 2015-11-21 ENCOUNTER — Telehealth: Payer: Self-pay | Admitting: Emergency Medicine

## 2015-11-21 NOTE — Telephone Encounter (Signed)
[name] in the Lab called to schedule a Lab appt, [name] wants the pt to have her urine checked again(from 1 week ago) and also an A1c blood test drawn. KMP

## 2015-11-21 NOTE — Telephone Encounter (Signed)
Pt. Returned call and made lab appt for 11-27-15. KMP

## 2015-11-21 NOTE — Telephone Encounter (Signed)
Spoke with patient.  She picked up prescription for Cipro on Tuesday, October 3rd.

## 2015-11-27 ENCOUNTER — Telehealth: Payer: Self-pay | Admitting: Medical

## 2015-11-27 ENCOUNTER — Other Ambulatory Visit (INDEPENDENT_AMBULATORY_CARE_PROVIDER_SITE_OTHER): Payer: Managed Care, Other (non HMO)

## 2015-11-27 DIAGNOSIS — R7309 Other abnormal glucose: Secondary | ICD-10-CM | POA: Diagnosis not present

## 2015-11-27 DIAGNOSIS — Z0000 Encounter for general adult medical examination without abnormal findings: Secondary | ICD-10-CM

## 2015-11-27 LAB — POCT URINALYSIS DIPSTICK
Bilirubin, UA: NEGATIVE
Glucose, UA: NEGATIVE
KETONES UA: NEGATIVE
LEUKOCYTES UA: NEGATIVE
NITRITE UA: NEGATIVE
PH UA: 6
PROTEIN UA: NEGATIVE
RBC UA: NEGATIVE
Spec Grav, UA: 1.025
Urobilinogen, UA: NEGATIVE

## 2015-11-27 LAB — HEMOGLOBIN A1C: HEMOGLOBIN A1C: 6.4 % (ref 4.6–6.5)

## 2015-11-27 MED ORDER — METFORMIN HCL 500 MG PO TABS: ORAL_TABLET | ORAL | 0 refills | Status: AC

## 2015-11-27 NOTE — Telephone Encounter (Signed)
Sent metformin to pharmacy. Advise will repeat test to recheck average blood sugar in 3 months. So appointment in 3 months.

## 2015-11-28 NOTE — Telephone Encounter (Signed)
Called patient to inform her that medication has been sent to pharmacy. Advised to schedule 3 month appointment.

## 2015-11-29 ENCOUNTER — Telehealth: Payer: Self-pay

## 2015-11-29 NOTE — Telephone Encounter (Signed)
Called patient left message regarding lab results also mailed copy of results.

## 2016-07-15 ENCOUNTER — Encounter: Payer: Self-pay | Admitting: Medical

## 2016-07-15 ENCOUNTER — Ambulatory Visit (INDEPENDENT_AMBULATORY_CARE_PROVIDER_SITE_OTHER): Payer: Commercial Managed Care - PPO | Admitting: Medical

## 2016-07-15 VITALS — BP 140/78 | HR 95 | Temp 97.8°F | Resp 16 | Ht 63.0 in | Wt 188.4 lb

## 2016-07-15 DIAGNOSIS — R1013 Epigastric pain: Secondary | ICD-10-CM

## 2016-07-15 MED ORDER — RANITIDINE HCL 150 MG PO CAPS: 150.0000 mg | ORAL_CAPSULE | Freq: Two times a day (BID) | ORAL | 0 refills | Status: AC

## 2016-07-15 NOTE — Patient Instructions (Addendum)
For your abdomen pain will advise bland and healthy diet. Can use ranitidine if needed for pain.   Will get cbc, cmp, amylase, lipase and h pylori breath test.  Will follow labs and see how you are doing. Consider getting ultrasound if labs negative but pain persists.  Follow up 5 days or as needed

## 2016-07-15 NOTE — Progress Notes (Signed)
Subjective:    Patient ID: [name], female    DOB: 1960/04/04, 56 y.o.   MRN: [id number]  HPI  Pt in with abdomen pain for about a year. Pain mostly epigastric area. Sometimes after eating but not always. Can occur first thing in morning.  Will make her nausea at times but never vomits. No uti signs or symptoms. No back pain associated with this. Pt does not take any medication for this.  Pt had pain last this morning for about one hour then resolved. Pt denies any history of diagnosed heart burn. On average pain when has last one hour and occurs about 4-5 times a month over past year.     Review of Systems  Constitutional: Negative for chills.  HENT: Negative for congestion, ear discharge and ear pain.   Respiratory: Negative for cough, chest tightness, shortness of breath and wheezing.   Cardiovascular: Negative for chest pain and palpitations.  Gastrointestinal: Positive for abdominal pain. Negative for abdominal distention, blood in stool, constipation, nausea, rectal pain and vomiting.       Faint on exam today.  Genitourinary: Negative for dysuria and enuresis.  Musculoskeletal: Negative for back pain and gait problem.  Skin: Negative for rash.  Neurological: Negative for dizziness, speech difficulty, weakness, numbness and headaches.  Hematological: Negative for adenopathy. Does not bruise/bleed easily.  Psychiatric/Behavioral: Negative for behavioral problems, confusion and dysphoric mood.    Past Medical History:  Diagnosis Date  . Kidney stone   . Neuromuscular disorder [name])      Social History   Social History  . Marital status: Married    Spouse name: N/A  . Number of children: N/A  . Years of education: N/A   Occupational History  . Not on file.   Social History Main Topics  . Smoking status: Never Smoker  . Smokeless tobacco: Never Used  . Alcohol use 1.2 oz/week    2 Glasses of wine per week     Comment: 09/28/2013 "2, 6oz glasses of  wine/day"  . Drug use: No  . Sexual activity: Yes   Other Topics Concern  . Not on file   Social History Narrative  . No narrative on file    Past Surgical History:  Procedure Laterality Date  . CARPAL TUNNEL RELEASE     Right hand  . COLONOSCOPY    . DILATION AND CURETTAGE OF UTERUS  2007  . ENDOMETRIAL ABLATION  2007  . PARATHYROIDECTOMY  09/28/2013  . PARATHYROIDECTOMY N/A 09/28/2013   Procedure: PARATHYROIDECTOMY;  Surgeon: [name];  Location: [facility];  Service: General;  Laterality: N/A;  . TONSILLECTOMY  ~ 1971  . TUBAL LIGATION Bilateral 2007    Family History  Problem Relation Age of Onset  . Heart disease Mother     No Known Allergies  Current Outpatient Prescriptions on File Prior to Visit  Medication Sig Dispense Refill  . atorvastatin (LIPITOR) 10 MG tablet Take 1 tablet (10 mg total) by mouth daily. (Patient not taking: Reported on 07/15/2016) 30 tablet 3  . metFORMIN (GLUCOPHAGE) 500 MG tablet 1 tab po q day (Patient not taking: Reported on 07/15/2016) 90 tablet 0   No current facility-administered medications on file prior to visit.     BP 140/78 (BP Location: Right Arm, Patient Position: Sitting, Cuff Size: Normal)   Pulse 95   Temp 97.8 F (36.6 C) (Oral)   Resp 16   Ht 5\' 3"  (1.6 m)   Wt 188  lb 6.4 oz (85.5 kg)   SpO2 97%   BMI 33.37 kg/m       Objective:   Physical Exam  General Appearance- Not in acute distress.  HEENT Eyes- Scleraeral/Conjuntiva-bilat- Not Yellow. Mouth & Throat- Normal.  Chest and Lung Exam Auscultation: Breath sounds:-Normal. Adventitious sounds:- No Adventitious sounds.  Cardiovascular Auscultation:Rythm - Regular. Heart Sounds -Normal heart sounds.  Abdomen Inspection:-Inspection Normal.  Palpation/Perucssion: Palpation and Percussion of the abdomen reveal- faint Tender epigastric tender, No Rebound tenderness, No rigidity(Guarding) and No Palpable abdominal masses.  Liver:-Normal.  Spleen:-  Normal.   Back- No cva tenderness.      Assessment & Plan:  For your abdomen pain will advise bland and healthy diet. Can use ranitidine if needed for pain.   Will get cbc, cmp, amylase, lipase and h pylori breath test.  Will follow labs and see how you are doing. Consider getting ultrasound if labs negative but pain persists.  Follow up 5 days or as needed  [name], [name], [name]

## 2016-07-16 LAB — CBC WITH DIFFERENTIAL/PLATELET
BASOS PCT: 1.2 % (ref 0.0–3.0)
Basophils Absolute: 0.1 10*3/uL (ref 0.0–0.1)
EOS PCT: 1.9 % (ref 0.0–5.0)
Eosinophils Absolute: 0.1 10*3/uL (ref 0.0–0.7)
HCT: 43.1 % (ref 36.0–46.0)
Hemoglobin: 14.2 g/dL (ref 12.0–15.0)
LYMPHS ABS: 2.8 10*3/uL (ref 0.7–4.0)
Lymphocytes Relative: 38.9 % (ref 12.0–46.0)
MCHC: 33 g/dL (ref 30.0–36.0)
MCV: 86.2 fl (ref 78.0–100.0)
MONO ABS: 0.6 10*3/uL (ref 0.1–1.0)
MONOS PCT: 8.9 % (ref 3.0–12.0)
NEUTROS ABS: 3.6 10*3/uL (ref 1.4–7.7)
NEUTROS PCT: 49.1 % (ref 43.0–77.0)
PLATELETS: 251 10*3/uL (ref 150.0–400.0)
RBC: 5 Mil/uL (ref 3.87–5.11)
RDW: 14 % (ref 11.5–15.5)
WBC: 7.3 10*3/uL (ref 4.0–10.5)

## 2016-07-16 LAB — H. PYLORI BREATH TEST: H. pylori Breath Test: NOT DETECTED

## 2016-07-16 LAB — COMPREHENSIVE METABOLIC PANEL
ALBUMIN: 4.8 g/dL (ref 3.5–5.2)
ALT: 33 U/L (ref 0–35)
AST: 21 U/L (ref 0–37)
Alkaline Phosphatase: 59 U/L (ref 39–117)
BILIRUBIN TOTAL: 0.5 mg/dL (ref 0.2–1.2)
BUN: 15 mg/dL (ref 6–23)
CALCIUM: 9.7 mg/dL (ref 8.4–10.5)
CHLORIDE: 104 meq/L (ref 96–112)
CO2: 33 mEq/L — ABNORMAL HIGH (ref 19–32)
CREATININE: 0.64 mg/dL (ref 0.40–1.20)
GFR: 102.9 mL/min (ref >60.00)
Glucose, Bld: 75 mg/dL (ref 70–99)
Potassium: 4 mEq/L (ref 3.5–5.1)
Sodium: 144 mEq/L (ref 135–145)
Total Protein: 7.4 g/dL (ref 6.0–8.3)

## 2016-07-16 LAB — AMYLASE: AMYLASE: 43 U/L (ref 27–131)

## 2016-07-16 LAB — LIPASE: LIPASE: 16 U/L (ref 11.0–59.0)

## 2016-07-21 ENCOUNTER — Ambulatory Visit: Payer: Commercial Managed Care - PPO | Admitting: Medical

## 2020-04-14 ENCOUNTER — Other Ambulatory Visit: Payer: Self-pay

## 2020-04-14 ENCOUNTER — Emergency Department (HOSPITAL_BASED_OUTPATIENT_CLINIC_OR_DEPARTMENT_OTHER): Payer: BC Managed Care – PPO

## 2020-04-14 ENCOUNTER — Emergency Department (HOSPITAL_BASED_OUTPATIENT_CLINIC_OR_DEPARTMENT_OTHER)
Admission: EM | Admit: 2020-04-14 | Discharge: 2020-04-14 | Disposition: A | Discharge status: Home / Self Care | Payer: BC Managed Care – PPO | Attending: Emergency Medicine | Admitting: Emergency Medicine

## 2020-04-14 ENCOUNTER — Encounter (HOSPITAL_BASED_OUTPATIENT_CLINIC_OR_DEPARTMENT_OTHER): Payer: Self-pay

## 2020-04-14 DIAGNOSIS — W01198A Fall on same level from slipping, tripping and stumbling with subsequent striking against other object, initial encounter: Secondary | ICD-10-CM | POA: Diagnosis not present

## 2020-04-14 DIAGNOSIS — S8992XA Unspecified injury of left lower leg, initial encounter: Secondary | ICD-10-CM | POA: Diagnosis present

## 2020-04-14 DIAGNOSIS — S83412A Sprain of medial collateral ligament of left knee, initial encounter: Secondary | ICD-10-CM | POA: Insufficient documentation

## 2020-04-14 DIAGNOSIS — M25521 Pain in right elbow: Secondary | ICD-10-CM | POA: Diagnosis not present

## 2020-04-14 DIAGNOSIS — X501XXA Overexertion from prolonged static or awkward postures, initial encounter: Secondary | ICD-10-CM | POA: Diagnosis not present

## 2020-04-14 NOTE — ED Notes (Signed)
EMT applying knee sleeve and teaching use of crutches. Pt discharged to home. Discharge instructions have been discussed with patient and/or family members. Pt verbally acknowledges understanding d/c instructions, and endorses comprehension to checkout at registration before leaving.

## 2020-04-14 NOTE — ED Notes (Signed)
Patient transported to X-ray 

## 2020-04-14 NOTE — ED Triage Notes (Signed)
Pt arrives with c/o pain to left knee after twisting it today r/t tripping over laundry in the floor. Pt did take 800 mg ibuprofen PTA.

## 2020-04-14 NOTE — Discharge Instructions (Signed)
How is this treated? treatment for this condition depends on how severe the injury is. Treatment may include: Keeping weight off the knee until swelling and pain improve. Raising (elevating) the knee above the level of your heart. This helps to reduce swelling. Icing the knee. This helps to reduce swelling. Taking an NSAID, such as ibuprofen. This helps to reduce pain and swelling. Using a knee brace, elastic sleeve, or crutches while the injury heals. Using a knee brace when participating in athletic activities. Doing rehab exercises (physical therapy). Surgery. This may be needed if: Your MCL tore all the way through. Your knee is unstable. Your knee is not getting better with other treatments. Contact a health care provider if: Your symptoms do not improve. Your symptoms get worse.

## 2020-04-14 NOTE — ED Provider Notes (Signed)
[place] EMERGENCY DEPARTMENT Provider Note   CSN: [id number] Arrival date & time: 04/14/20  1504     History Chief Complaint  Patient presents with  . Knee Pain    [name] is a 60 y.o. female.  Who presents with a chief complaint of left knee and right elbow pain.  The patient states that she tripped over a laundry basket, twisted her knee while was planted, felt a pop on the medial side, and fell hitting her left elbow on the floor.  She did not hit her head or lose consciousness.  She had immediate pain in the knee and had difficulty ambulating.  She had some numbness in the foot which has resolved.  She denies weakness or discoloration of the lower extremity.  HPI     Past Medical History:  Diagnosis Date  . Kidney stone   . Neuromuscular disorder [facility])     Patient Active Problem List   Diagnosis Date Noted  . Dermatitis 05/08/2014  . Kidney stone 05/08/2014  . CTS (carpal tunnel syndrome) 05/08/2014  . Hyperparathyroidism, primary ([place]) 09/28/2013  . Primary hyperparathyroidism ([place]) 08/28/2013  . Hypercalcemia 06/19/2013    Past Surgical History:  Procedure Laterality Date  . CARPAL TUNNEL RELEASE     Right hand  . COLONOSCOPY    . DILATION AND CURETTAGE OF UTERUS  2007  . ENDOMETRIAL ABLATION  2007  . PARATHYROIDECTOMY  09/28/2013  . PARATHYROIDECTOMY N/A 09/28/2013   Procedure: PARATHYROIDECTOMY;  Surgeon: Joyice Faster. Cornett, MD;  Location: [facility];  Service: General;  Laterality: N/A;  . TONSILLECTOMY  ~ 1971  . TUBAL LIGATION Bilateral 2007     OB History   No obstetric history on file.     Family History  Problem Relation Age of Onset  . Heart disease Mother     Social History   Tobacco Use  . Smoking status: Never Smoker  . Smokeless tobacco: Never Used  Substance Use Topics  . Alcohol use: Yes    Alcohol/week: 2.0 standard drinks    Types: 2 Glasses of wine per week    Comment: 09/28/2013 "2, 6oz glasses of  wine/day"  . Drug use: No    Home Medications Prior to Admission medications   Medication Sig Start Date End Date Taking? Authorizing Provider  atorvastatin (LIPITOR) 10 MG tablet Take 1 tablet (10 mg total) by mouth daily. Patient not taking: No sig reported 11/18/15   Saguier, Percell Miller, PA-C  metFORMIN (GLUCOPHAGE) 500 MG tablet 1 tab po q day Patient not taking: Reported on 07/15/2016 11/27/15   Saguier, Percell Miller, PA-C  ranitidine (ZANTAC) 150 MG capsule Take 1 capsule (150 mg total) by mouth 2 (two) times daily. 07/15/16   Brooks, Percell Miller, PA-C    Allergies    Patient has no known allergies.  Review of Systems   Review of Systems  Musculoskeletal: Positive for gait problem. Negative for joint swelling.  Skin: Negative for wound.  Neurological: Negative for weakness.    Physical Exam Updated Vital Signs BP 133/83 (BP Location: Right Arm)   Pulse 92   Temp 98.3 F (36.8 C) (Oral)   Resp 18   Ht 5\' 3"  (1.6 m)   Wt 80.7 kg   SpO2 97%   BMI 31.53 kg/m   Physical Exam Vitals and nursing note reviewed.  Constitutional:      General: She is not in acute distress.    Appearance: She is well-developed and well-nourished. She is not diaphoretic.  HENT:     Head: Normocephalic and atraumatic.  Eyes:     General: No scleral icterus.    Conjunctiva/sclera: Conjunctivae normal.  Cardiovascular:     Rate and Rhythm: Normal rate and regular rhythm.     Heart sounds: Normal heart sounds. No murmur heard. No friction rub. No gallop.   Pulmonary:     Effort: Pulmonary effort is normal. No respiratory distress.     Breath sounds: Normal breath sounds.  Abdominal:     General: Bowel sounds are normal. There is no distension.     Palpations: Abdomen is soft. There is no mass.     Tenderness: There is no abdominal tenderness. There is no guarding.  Musculoskeletal:     Cervical back: Normal range of motion.     Comments: Left knee without swelling, or brusing. FROM. Tenderness along  the MCL Ligaments are stable. Right elbow with full range of motion, tenderness to the lateral condyle, no tenderness with supination or pronation, no obvious swelling or deformity.  Skin:    General: Skin is warm and dry.  Neurological:     Mental Status: She is alert and oriented to person, place, and time.  Psychiatric:        Behavior: Behavior normal.    . ED Results / Procedures / Treatments   Labs (all labs ordered are listed, but only abnormal results are displayed) Labs Reviewed - No data to display  EKG None  Radiology DG ELBOW COMPLETE RIGHT (3+VIEW)  Result Date: 04/14/2020 CLINICAL DATA:  Right elbow pain post tripping. EXAM: RIGHT ELBOW - COMPLETE 3+ VIEW COMPARISON:  None. FINDINGS: There is no evidence of fracture, dislocation, or joint effusion. There is no evidence of arthropathy or other focal bone abnormality. Soft tissues are unremarkable. IMPRESSION: Negative. Electronically Signed   By: Fidela Salisbury M.D.   On: 04/14/2020 16:05   DG Knee Complete 4 Views Left  Result Date: 04/14/2020 CLINICAL DATA:  Left knee pain post tripping. EXAM: LEFT KNEE - COMPLETE 4+ VIEW COMPARISON:  None. FINDINGS: No evidence of fracture, dislocation, or joint effusion. No evidence of arthropathy or other focal bone abnormality. Soft tissues are unremarkable. IMPRESSION: Negative. Electronically Signed   By: Fidela Salisbury M.D.   On: 04/14/2020 16:05    Procedures Procedures   Medications Ordered in ED Medications - No data to display  ED Course  I have reviewed the triage vital signs and the nursing notes.  Pertinent labs & imaging results that were available during my care of the patient were reviewed by me and considered in my medical decision making (see chart for details).    MDM Rules/Calculators/A&P                          Patient X-Rays negative for obvious fracture or dislocation. Pain managed in ED. Pt advised to follow up with orthopedics if  symptoms persist for possibility of missed fracture diagnosis. Patient given brace while in ED, conservative therapy recommended and discussed. Patient will be dc home & is agreeable with above plan.  Final Clinical Impression(s) / ED Diagnoses Final diagnoses:  Sprain of medial collateral ligament of left knee, initial encounter    Rx / DC Orders ED Discharge Orders    None       Margarita Mail, PA-C 04/14/20 1623    Isla Pence, MD 04/14/20 [phone]

## 2020-04-15 ENCOUNTER — Ambulatory Visit: Payer: Self-pay

## 2020-04-15 ENCOUNTER — Ambulatory Visit (INDEPENDENT_AMBULATORY_CARE_PROVIDER_SITE_OTHER): Payer: BC Managed Care – PPO | Admitting: Family Medicine

## 2020-04-15 ENCOUNTER — Encounter: Payer: Self-pay | Admitting: Family Medicine

## 2020-04-15 ENCOUNTER — Telehealth: Payer: Self-pay | Admitting: Medical

## 2020-04-15 VITALS — BP 130/80 | Ht 63.0 in | Wt 178.0 lb

## 2020-04-15 DIAGNOSIS — M7711 Lateral epicondylitis, right elbow: Secondary | ICD-10-CM

## 2020-04-15 DIAGNOSIS — M25562 Pain in left knee: Secondary | ICD-10-CM

## 2020-04-15 DIAGNOSIS — S83412A Sprain of medial collateral ligament of left knee, initial encounter: Secondary | ICD-10-CM

## 2020-04-15 HISTORY — DX: Lateral epicondylitis, right elbow: M77.11

## 2020-04-15 NOTE — Assessment & Plan Note (Signed)
Has pain over the lateral condyle but no significant changes observed on ultrasound or x-ray.  May be a bony contusion of this area. -Counseled on home exercise therapy and supportive care. -Pennsaid samples. -Could consider physical therapy.

## 2020-04-15 NOTE — Patient Instructions (Signed)
Nice to meet you Please try the range of motion  Please try ice  Please try the rub on medicine  Please send me a message in MyChart with any questions or updates.  Please see me back in 3 weeks.   --Dr. Raeford Razor

## 2020-04-15 NOTE — Telephone Encounter (Signed)
3 years since seen. ED sent me note. Offer appt to follow form ED.

## 2020-04-15 NOTE — Telephone Encounter (Signed)
Patient has new PCP , established care with PCP on 11/2019 .

## 2020-04-15 NOTE — Progress Notes (Signed)
Medication Samples have been provided to the patient.  Drug name: Pennsaid      Strength: 2%        Qty: 1 box LOT: [id number]  Exp.Date: 06/2020  Dosing instructions: Use a pea sized amount on affected twice daily.   The patient has been instructed regarding the correct time, dose, and frequency of taking this medication, including desired effects and most common side effects.   Jonathon Resides 10:18 AM 04/15/2020

## 2020-04-15 NOTE — Assessment & Plan Note (Signed)
Injury occurred on 2/27.  Does have changes at the origin of the MCL.  No effusion. -Counseled on home exercise therapy and supportive care. -Continue hinged knee brace. -Could consider physical therapy.

## 2020-04-15 NOTE — Progress Notes (Signed)
[name] - 60 y.o. female MRN [id number]  Date of birth: 18-Oct-1960  SUBJECTIVE:  Including CC & ROS.  No chief complaint on file.   [name] is a 60 y.o. female that is presenting with right elbow pain and left knee pain.  She had a fall yesterday in which she tripped.  She felt a twisting and pulling motion of the left knee.  She landed on the right elbow and having pain over the lateral epicondyle.  No history of similar pain or injury.  Has had improvement with ibuprofen.  Independent review of the left knee x-ray from 2/27 shows no acute abnormality.   Review of Systems See HPI   HISTORY: Past Medical, Surgical, Social, and Family History Reviewed & Updated per EMR.   Pertinent Historical Findings include:  Past Medical History:  Diagnosis Date  . Kidney stone   . Lateral epicondylitis of right elbow 04/15/2020  . Neuromuscular disorder [facility])     Past Surgical History:  Procedure Laterality Date  . CARPAL TUNNEL RELEASE     Right hand  . COLONOSCOPY    . DILATION AND CURETTAGE OF UTERUS  2007  . ENDOMETRIAL ABLATION  2007  . PARATHYROIDECTOMY  09/28/2013  . PARATHYROIDECTOMY N/A 09/28/2013   Procedure: PARATHYROIDECTOMY;  Surgeon: Joyice Faster. Cornett, MD;  Location: [place];  Service: General;  Laterality: N/A;  . TONSILLECTOMY  ~ 1971  . TUBAL LIGATION Bilateral 2007    Family History  Problem Relation Age of Onset  . Heart disease Mother     Social History   Socioeconomic History  . Marital status: Married    Spouse name: Not on file  . Number of children: Not on file  . Years of education: Not on file  . Highest education level: Not on file  Occupational History  . Not on file  Tobacco Use  . Smoking status: Never Smoker  . Smokeless tobacco: Never Used  Substance and Sexual Activity  . Alcohol use: Yes    Alcohol/week: 2.0 standard drinks    Types: 2 Glasses of wine per week    Comment: 09/28/2013 "2, 6oz glasses of wine/day"   . Drug use: No  . Sexual activity: Yes  Other Topics Concern  . Not on file  Social History Narrative  . Not on file   Social Determinants of Health   Financial Resource Strain: Not on file  Food Insecurity: Not on file  Transportation Needs: Not on file  Physical Activity: Not on file  Stress: Not on file  Social Connections: Not on file  Intimate Partner Violence: Not on file     PHYSICAL EXAM:  VS: BP 130/80 (BP Location: Left Arm, Patient Position: Sitting, Cuff Size: Large)   Ht 5\' 3"  (1.6 m)   Wt 178 lb (80.7 kg)   BMI 31.53 kg/m  Physical Exam Gen: NAD, alert, cooperative with exam, well-appearing MSK:  Right elbow: Tenderness palpation over the lateral condyle. No swelling or ecchymosis. Normal range of motion. Normal strength resistance. Left knee: No effusion. Normal range of motion. Exacerbation of symptoms with valgus stressing. Neurovascularly intact  Limited ultrasound: Left knee, right elbow:  Left knee: No effusion. Normal-appearing quadricep patellar tendon. Mild medial joint space narrowing. Hyperemia associated at the origin of the MCL to suggest a mild sprain.  Right elbow: Mild spurring at the origin of the common extensors. No effusion of the elbow joint. No changes in the posterior elbow joint. Normal insertion of the  triceps tendon into the olecranon.  Summary: Findings would suggest MCL sprain  Ultrasound and interpretation by Clearance Coots, MD    ASSESSMENT & PLAN:   Lateral epicondylitis of right elbow Has pain over the lateral condyle but no significant changes observed on ultrasound or x-ray.  May be a bony contusion of this area. -Counseled on home exercise therapy and supportive care. -[place] samples. -Could consider physical therapy.  Sprain of medial collateral ligament of left knee Injury occurred on 2/27.  Does have changes at the origin of the MCL.  No effusion. -Counseled on home exercise therapy and  supportive care. -Continue hinged knee brace. -Could consider physical therapy.

## 2020-05-06 ENCOUNTER — Encounter: Payer: Self-pay | Admitting: Family Medicine

## 2020-05-06 ENCOUNTER — Ambulatory Visit (INDEPENDENT_AMBULATORY_CARE_PROVIDER_SITE_OTHER): Payer: BC Managed Care – PPO | Admitting: Family Medicine

## 2020-05-06 ENCOUNTER — Other Ambulatory Visit: Payer: Self-pay

## 2020-05-06 DIAGNOSIS — S83412D Sprain of medial collateral ligament of left knee, subsequent encounter: Secondary | ICD-10-CM

## 2020-05-06 NOTE — Progress Notes (Signed)
  [name] - 60 y.o. female MRN [id number]  Date of birth: 29-Dec-1960  SUBJECTIVE:  Including CC & ROS.  No chief complaint on file.   [name] is a 60 y.o. female that is following up for her left knee pain.  She still gets pain intermittently.  Pain has not been severe.  She only notices it with certain movements   Review of Systems See HPI   HISTORY: Past Medical, Surgical, Social, and Family History Reviewed & Updated per EMR.   Pertinent Historical Findings include:  Past Medical History:  Diagnosis Date  . Kidney stone   . Lateral epicondylitis of right elbow 04/15/2020  . Neuromuscular disorder [facility])     Past Surgical History:  Procedure Laterality Date  . CARPAL TUNNEL RELEASE     Right hand  . COLONOSCOPY    . DILATION AND CURETTAGE OF UTERUS  2007  . ENDOMETRIAL ABLATION  2007  . PARATHYROIDECTOMY  09/28/2013  . PARATHYROIDECTOMY N/A 09/28/2013   Procedure: PARATHYROIDECTOMY;  Surgeon: Joyice Faster. Cornett, MD;  Location: [place];  Service: General;  Laterality: N/A;  . TONSILLECTOMY  ~ 1971  . TUBAL LIGATION Bilateral 2007    Family History  Problem Relation Age of Onset  . Heart disease Mother     Social History   Socioeconomic History  . Marital status: Married    Spouse name: Not on file  . Number of children: Not on file  . Years of education: Not on file  . Highest education level: Not on file  Occupational History  . Not on file  Tobacco Use  . Smoking status: Never Smoker  . Smokeless tobacco: Never Used  Substance and Sexual Activity  . Alcohol use: Yes    Alcohol/week: 2.0 standard drinks    Types: 2 Glasses of wine per week    Comment: 09/28/2013 "2, 6oz glasses of wine/day"  . Drug use: No  . Sexual activity: Yes  Other Topics Concern  . Not on file  Social History Narrative  . Not on file   Social Determinants of Health   Financial Resource Strain: Not on file  Food Insecurity: Not on file  Transportation  Needs: Not on file  Physical Activity: Not on file  Stress: Not on file  Social Connections: Not on file  Intimate Partner Violence: Not on file     PHYSICAL EXAM:  VS: BP 128/88 (BP Location: Left Arm, Patient Position: Sitting, Cuff Size: Large)   Ht 5\' 3"  (1.6 m)   Wt 178 lb (80.7 kg)   BMI 31.53 kg/m  Physical Exam Gen: NAD, alert, cooperative with exam, well-appearing MSK:  Left knee: No swelling or ecchymosis. No knee effusion. Normal range of motion. Some tenderness palpation at the origin of the MCL. Neurovascular intact     ASSESSMENT & PLAN:   Sprain of medial collateral ligament of left knee Only has intermittent pain currently.  Notices it with certain movements. -Counseled on home exercise therapy and supportive care. -Can wean out of the brace. -Could consider further imaging or physical therapy.

## 2020-05-06 NOTE — Assessment & Plan Note (Signed)
Only has intermittent pain currently.  Notices it with certain movements. -Counseled on home exercise therapy and supportive care. -Can wean out of the brace. -Could consider further imaging or physical therapy.

## 2020-12-30 ENCOUNTER — Emergency Department (HOSPITAL_BASED_OUTPATIENT_CLINIC_OR_DEPARTMENT_OTHER): Payer: BC Managed Care – PPO

## 2020-12-30 ENCOUNTER — Emergency Department (HOSPITAL_BASED_OUTPATIENT_CLINIC_OR_DEPARTMENT_OTHER)
Admission: EM | Admit: 2020-12-30 | Discharge: 2020-12-31 | Disposition: A | Discharge status: Home / Self Care | Payer: BC Managed Care – PPO | Attending: Emergency Medicine | Admitting: Emergency Medicine

## 2020-12-30 ENCOUNTER — Encounter (HOSPITAL_BASED_OUTPATIENT_CLINIC_OR_DEPARTMENT_OTHER): Payer: Self-pay | Admitting: Urology

## 2020-12-30 ENCOUNTER — Other Ambulatory Visit: Payer: Self-pay

## 2020-12-30 DIAGNOSIS — Y92410 Unspecified street and highway as the place of occurrence of the external cause: Secondary | ICD-10-CM | POA: Insufficient documentation

## 2020-12-30 DIAGNOSIS — R0789 Other chest pain: Secondary | ICD-10-CM | POA: Insufficient documentation

## 2020-12-30 DIAGNOSIS — S298XXA Other specified injuries of thorax, initial encounter: Secondary | ICD-10-CM

## 2020-12-30 DIAGNOSIS — S5011XA Contusion of right forearm, initial encounter: Secondary | ICD-10-CM | POA: Diagnosis not present

## 2020-12-30 DIAGNOSIS — S3991XA Unspecified injury of abdomen, initial encounter: Secondary | ICD-10-CM

## 2020-12-30 DIAGNOSIS — R109 Unspecified abdominal pain: Secondary | ICD-10-CM | POA: Diagnosis not present

## 2020-12-30 DIAGNOSIS — S59911A Unspecified injury of right forearm, initial encounter: Secondary | ICD-10-CM | POA: Diagnosis present

## 2020-12-30 DIAGNOSIS — T1490XA Injury, unspecified, initial encounter: Secondary | ICD-10-CM

## 2020-12-30 LAB — CBC WITH DIFFERENTIAL/PLATELET
Abs Immature Granulocytes: 0.02 10*3/uL (ref 0.00–0.07)
Basophils Absolute: 0 10*3/uL (ref 0.0–0.1)
Basophils Relative: 0 %
Eosinophils Absolute: 0.1 10*3/uL (ref 0.0–0.5)
Eosinophils Relative: 1 %
HCT: 41.4 % (ref 36.0–46.0)
Hemoglobin: 13.8 g/dL (ref 12.0–15.0)
Immature Granulocytes: 0 %
Lymphocytes Relative: 25 %
Lymphs Abs: 1.8 10*3/uL (ref 0.7–4.0)
MCH: 28.3 pg (ref 26.0–34.0)
MCHC: 33.3 g/dL (ref 30.0–36.0)
MCV: 84.8 fL (ref 80.0–100.0)
Monocytes Absolute: 0.6 10*3/uL (ref 0.1–1.0)
Monocytes Relative: 8 %
Neutro Abs: 4.7 10*3/uL (ref 1.7–7.7)
Neutrophils Relative %: 66 %
Platelets: 223 10*3/uL (ref 150–400)
RBC: 4.88 MIL/uL (ref 3.87–5.11)
RDW: 13.1 % (ref 11.5–15.5)
WBC: 7.1 10*3/uL (ref 4.0–10.5)
nRBC: 0 % (ref 0.0–0.2)

## 2020-12-30 MED ORDER — HYDROCODONE-ACETAMINOPHEN 5-325 MG PO TABS
1.0000 | ORAL_TABLET | Freq: Once | ORAL | Status: AC
  Administered 2020-12-30: 1 via ORAL
  Filled 2020-12-30: qty 1
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
## 2020-12-31 ENCOUNTER — Emergency Department (HOSPITAL_BASED_OUTPATIENT_CLINIC_OR_DEPARTMENT_OTHER): Payer: BC Managed Care – PPO

## 2020-12-31 LAB — BASIC METABOLIC PANEL
Anion gap: 8 (ref 5–15)
BUN: 10 mg/dL (ref 6–20)
CO2: 26 mmol/L (ref 22–32)
Calcium: 8.8 mg/dL — ABNORMAL LOW (ref 8.9–10.3)
Chloride: 104 mmol/L (ref 98–111)
Creatinine, Ser: 0.6 mg/dL (ref 0.44–1.00)
GFR, Estimated: >60 mL/min (ref >60)
Glucose, Bld: 106 mg/dL — ABNORMAL HIGH (ref 70–99)
Potassium: 4.3 mmol/L (ref 3.5–5.1)
Sodium: 138 mmol/L (ref 135–145)

## 2020-12-31 MED ORDER — IOHEXOL 300 MG/ML  SOLN
100.0000 mL | Freq: Once | INTRAMUSCULAR | Status: AC | PRN
  Administered 2020-12-31: 100 mL via INTRAVENOUS

## 2020-12-31 MED ORDER — CYCLOBENZAPRINE HCL 10 MG PO TABS: 10.0000 mg | ORAL_TABLET | Freq: Two times a day (BID) | ORAL | 0 refills | Status: AC | PRN

## 2020-12-31 NOTE — ED Provider Notes (Signed)
I assumed care in signout to follow-up on imaging.  All studies are negative.  Patient has no other acute complaints.  She is safe for discharge home.  She request muscle relaxant discharge.  Flexeril sent to pharmacy   Ripley Fraise, MD 12/31/20 0110

## 2022-06-01 ENCOUNTER — Encounter: Payer: Self-pay | Admitting: *Deleted
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
